# Patient Record
Sex: Female | Born: 1975 | Race: Black or African American | Hispanic: No
Health system: Midwestern US, Community
[De-identification: ages and names within clinical notes are randomized; demographics above are authoritative.]

## PROBLEM LIST (undated history)

## (undated) HISTORY — PX: WRIST FRACTURE SURGERY: SHX121

## (undated) HISTORY — PX: FOOT FRACTURE SURGERY: SHX645

## (undated) HISTORY — PX: APPENDECTOMY: SHX54

---

## 1998-12-25 ENCOUNTER — Emergency Department (HOSPITAL_COMMUNITY): Admission: EM | Admit: 1998-12-25 | Discharge: 1998-12-25 | Payer: Self-pay | Admitting: Emergency Medicine

## 1998-12-25 ENCOUNTER — Encounter: Payer: Self-pay | Admitting: Emergency Medicine

## 1999-03-11 ENCOUNTER — Emergency Department (HOSPITAL_COMMUNITY): Admission: EM | Admit: 1999-03-11 | Discharge: 1999-03-11 | Payer: Self-pay | Admitting: Emergency Medicine

## 2000-10-07 ENCOUNTER — Inpatient Hospital Stay (HOSPITAL_COMMUNITY): Admission: EM | Admit: 2000-10-07 | Discharge: 2000-10-08 | Payer: Self-pay | Admitting: Emergency Medicine

## 2000-10-08 ENCOUNTER — Inpatient Hospital Stay (HOSPITAL_COMMUNITY): Admission: EM | Admit: 2000-10-08 | Discharge: 2000-10-10 | Payer: Self-pay | Admitting: *Deleted

## 2000-10-13 ENCOUNTER — Other Ambulatory Visit (HOSPITAL_COMMUNITY): Admission: RE | Admit: 2000-10-13 | Discharge: 2000-10-15 | Payer: Self-pay | Admitting: Psychiatry

## 2001-04-09 ENCOUNTER — Emergency Department (HOSPITAL_COMMUNITY): Admission: EM | Admit: 2001-04-09 | Discharge: 2001-04-09 | Payer: Self-pay | Admitting: Emergency Medicine

## 2001-04-09 ENCOUNTER — Encounter: Payer: Self-pay | Admitting: Emergency Medicine

## 2003-03-25 ENCOUNTER — Other Ambulatory Visit: Admission: RE | Admit: 2003-03-25 | Discharge: 2003-03-25 | Payer: Self-pay | Admitting: Gynecology

## 2003-05-10 ENCOUNTER — Emergency Department (HOSPITAL_COMMUNITY): Admission: EM | Admit: 2003-05-10 | Discharge: 2003-05-11 | Payer: Self-pay | Admitting: Emergency Medicine

## 2003-05-12 ENCOUNTER — Emergency Department (HOSPITAL_COMMUNITY): Admission: EM | Admit: 2003-05-12 | Discharge: 2003-05-12 | Payer: Self-pay | Admitting: Emergency Medicine

## 2009-05-02 ENCOUNTER — Inpatient Hospital Stay (HOSPITAL_COMMUNITY): Admission: RE | Admit: 2009-05-02 | Discharge: 2009-05-10 | Payer: Self-pay | Admitting: Psychiatry

## 2009-05-02 ENCOUNTER — Ambulatory Visit: Payer: Self-pay | Admitting: Psychiatry

## 2009-05-07 ENCOUNTER — Emergency Department (HOSPITAL_COMMUNITY): Admission: EM | Admit: 2009-05-07 | Discharge: 2009-05-07 | Payer: Self-pay | Admitting: Emergency Medicine

## 2009-08-25 ENCOUNTER — Emergency Department (HOSPITAL_COMMUNITY): Admission: EM | Admit: 2009-08-25 | Discharge: 2009-08-26 | Payer: Self-pay | Admitting: Emergency Medicine

## 2009-11-03 ENCOUNTER — Emergency Department (HOSPITAL_COMMUNITY): Admission: EM | Admit: 2009-11-03 | Discharge: 2009-11-03 | Payer: Self-pay | Admitting: Emergency Medicine

## 2009-11-09 ENCOUNTER — Ambulatory Visit (HOSPITAL_COMMUNITY): Admission: RE | Admit: 2009-11-09 | Discharge: 2009-11-09 | Payer: Self-pay | Admitting: Psychiatry

## 2010-08-07 ENCOUNTER — Emergency Department (HOSPITAL_COMMUNITY)
Admission: EM | Admit: 2010-08-07 | Discharge: 2010-08-08 | Payer: Self-pay | Source: Home / Self Care | Admitting: Emergency Medicine

## 2010-10-10 LAB — CBC
HCT: 35.3 % — ABNORMAL LOW (ref 36.0–46.0)
Hemoglobin: 11.9 g/dL — ABNORMAL LOW (ref 12.0–15.0)
MCHC: 33.7 g/dL (ref 30.0–36.0)
Platelets: 341 10*3/uL (ref 150–400)
RDW: 13.4 % (ref 11.5–15.5)

## 2010-10-10 LAB — URINALYSIS, ROUTINE W REFLEX MICROSCOPIC
Ketones, ur: NEGATIVE mg/dL
Nitrite: NEGATIVE
Specific Gravity, Urine: 1.014 (ref 1.005–1.030)
pH: 6 (ref 5.0–8.0)

## 2010-10-10 LAB — URINE MICROSCOPIC-ADD ON

## 2010-10-10 LAB — COMPREHENSIVE METABOLIC PANEL
BUN: 7 mg/dL (ref 6–23)
Calcium: 8.9 mg/dL (ref 8.4–10.5)
Glucose, Bld: 104 mg/dL — ABNORMAL HIGH (ref 70–99)
Sodium: 130 mEq/L — ABNORMAL LOW (ref 135–145)
Total Protein: 8.1 g/dL (ref 6.0–8.3)

## 2010-10-10 LAB — DIFFERENTIAL
Lymphocytes Relative: 7 % — ABNORMAL LOW (ref 12–46)
Lymphs Abs: 0.9 10*3/uL (ref 0.7–4.0)
Monocytes Relative: 6 % (ref 3–12)
Neutro Abs: 11.1 10*3/uL — ABNORMAL HIGH (ref 1.7–7.7)
Neutrophils Relative %: 86 % — ABNORMAL HIGH (ref 43–77)

## 2010-10-10 LAB — URINE CULTURE: Colony Count: 60000

## 2010-10-10 LAB — LIPASE, BLOOD: Lipase: 23 U/L (ref 11–59)

## 2010-10-25 LAB — BASIC METABOLIC PANEL
CO2: 27 mEq/L (ref 19–32)
Calcium: 9 mg/dL (ref 8.4–10.5)
Glucose, Bld: 92 mg/dL (ref 70–99)
Sodium: 136 mEq/L (ref 135–145)

## 2010-10-25 LAB — URINALYSIS, ROUTINE W REFLEX MICROSCOPIC
Bilirubin Urine: NEGATIVE
Glucose, UA: NEGATIVE mg/dL
Glucose, UA: NEGATIVE mg/dL
Hgb urine dipstick: NEGATIVE
Protein, ur: NEGATIVE mg/dL
Specific Gravity, Urine: 1.021 (ref 1.005–1.030)
Urobilinogen, UA: 0.2 mg/dL (ref 0.0–1.0)
pH: 6.5 (ref 5.0–8.0)

## 2010-10-25 LAB — CBC
HCT: 37.7 % (ref 36.0–46.0)
HCT: 37.2 % (ref 36.0–46.0)
Hemoglobin: 12.5 g/dL (ref 12.0–15.0)
Hemoglobin: 12.6 g/dL (ref 12.0–15.0)
Hemoglobin: 12.8 g/dL (ref 12.0–15.0)
MCHC: 33.9 g/dL (ref 30.0–36.0)
MCHC: 34.5 g/dL (ref 30.0–36.0)
RBC: 4.36 MIL/uL (ref 3.87–5.11)
RDW: 13.5 % (ref 11.5–15.5)
RDW: 13.8 % (ref 11.5–15.5)
RDW: 13.3 % (ref 11.5–15.5)

## 2010-10-25 LAB — DRUGS OF ABUSE SCREEN W/O ALC, ROUTINE URINE
Amphetamine Screen, Ur: NEGATIVE
Creatinine,U: 260.1 mg/dL
Marijuana Metabolite: NEGATIVE
Methadone: NEGATIVE
Opiate Screen, Urine: NEGATIVE

## 2010-10-25 LAB — GC/CHLAMYDIA PROBE AMP, URINE: GC Probe Amp, Urine: NEGATIVE

## 2010-10-25 LAB — COMPREHENSIVE METABOLIC PANEL
ALT: 18 U/L (ref 0–35)
Alkaline Phosphatase: 53 U/L (ref 39–117)
BUN: 6 mg/dL (ref 6–23)
BUN: 7 mg/dL (ref 6–23)
CO2: 29 mEq/L (ref 19–32)
Calcium: 8.9 mg/dL (ref 8.4–10.5)
GFR calc non Af Amer: >60 mL/min (ref >60)
Glucose, Bld: 84 mg/dL (ref 70–99)
Glucose, Bld: 82 mg/dL (ref 70–99)
Potassium: 3.6 mEq/L (ref 3.5–5.1)
Sodium: 138 mEq/L (ref 135–145)
Total Bilirubin: 0.8 mg/dL (ref 0.3–1.2)
Total Protein: 7.6 g/dL (ref 6.0–8.3)

## 2010-10-25 LAB — URINALYSIS, MICROSCOPIC ONLY
Glucose, UA: NEGATIVE mg/dL
Leukocytes, UA: NEGATIVE
Protein, ur: NEGATIVE mg/dL
Specific Gravity, Urine: 1.024 (ref 1.005–1.030)
Urobilinogen, UA: 0.2 mg/dL (ref 0.0–1.0)

## 2010-10-25 LAB — URINE MICROSCOPIC-ADD ON

## 2010-10-25 LAB — D-DIMER, QUANTITATIVE: D-Dimer, Quant: <0.22 ug/mL-FEU (ref 0.00–0.48)

## 2010-10-25 LAB — VALPROIC ACID LEVEL: Valproic Acid Lvl: 79.4 ug/mL (ref 50.0–100.0)

## 2010-12-07 NOTE — Discharge Summary (Signed)
[facility]  Patient:    [name], Linda Ramos                       MRN: [id number] Adm. Date:  [id number] Disc. Date: [id number] Attending:  Carolanne Grumbling D                           Discharge Summary  INTRODUCTION:  Linda Ramos [name] is a 35 year old African-American female who was admitted because of signs of depression.  She was transferred to [place] from [facility] after overdosing of approximately 50 aspirin.  Patient was not able to contract for safety and she was sent on involuntary commitment to our Behavioral Health unit.  The main factor contributing to her depression was pressure at work and some family problems.  Patient has previous history of suicidal gesture 2 years ago.  Details of patients history are available in the initial evaluation.  HOSPITAL COURSE:  After admitting to the ward, patient was placed on special observation and medication was offered.  Patient did not feel that she needed medication.  On examination upon admission, she presented with guarded, nonspontaneous mood and having partial insight into her actions leading to admission.  She did not want to stay in the  hospital, felt that she could benefit more from outpatient treatment in intensive outpatient program.  Case worker met with patients best friend and mother and mother was approving this plan.  Patient upon discharge did not want to take any medication.  MEDICAL COMPLICATIONS DURING THIS HOSPITAL STAY:  Patient did not have any medical problems or sequelae from her recent overdose.  Vital signs were stable.  Additional blood work showed normal CBC with borderline low white blood cell count 3.5 thousand per microliter.  Otherwise CBC was normal. Chemistry-17 was within normal limits.  Iron and total iron binding capacity were normal.  Percent of iron saturation was 15, slightly below normal.  ADMISSION DIAGNOSIS: Axis I:     Major depression, recurrent,  overlapping with adjustment disorder             with depressed mood. Axis II:    Diagnosis deferred. Axis III:   Status post overdose with questionable suicidal attempt on             aspirin on October 07, 2000. Axis IV:    Psychosocial stressors moderate to severe, occupational problems. Axis V:     Global assessment of function on admission was 30, upon discharge             60, maximum for past year 80.  DISCHARGE RECOMMENDATIONS:  Patient refused to take any medications.  She was discharged to mental health intensive outpatient program which is supposed to be started on October 13, 2000.  Over the weekend, she will stay with her family. DD:  11/06/00 TD:  11/06/00 Job: [id number]

## 2010-12-07 NOTE — H&P (Signed)
[facility]  Patient:    [name], Manasvini S                       MRN: [id number] Adm. Date:  [id number] Attending:  Denny Peon Dictator:   Johnella Moloney, N.P.                         History and Physical  IDENTIFYING INFORMATION:  Ms. [name] is a 35 year old, African-American female, single admitted October 08, 2000 on an involuntary commitment.  CHIEF COMPLAINT:  "Here for depression."  HISTORY OF PRESENT ILLNESS:  The patient was hospitalized at [facility] on October 07, 2000 after overdosing on approximately 50 aspirin. She would not contract for safety according to the petition and she was sent to [facility]. She did stay at [facility] from October 07, 2000 through October 08, 2000. The patient has very little to say and seems very resistant to give [place] much detail or much information. She states that she suddenly got depressed on October 07, 2000. She said she was upset about life and there was a lot of pressure at work and at times work was becoming overwhelming. She states she took the aspirin, although she does not know how many and she says this was not a suicide attempt, she thought she would just sleep. The patient reports that she sleeps a lot anyway. She denies fatigue. She does acknowledge that most of her life at times becomes irritable, angry and has mood swings, and this has occurred most of her life. She feels like she does not really want to go to work at [employer] currently due to the stress. She states that she stays really busy and states that things have seemed more stressful for the last two weeks. She states her appetite varies but she has had a weight loss of 7 pounds in two weeks. She is currently working two jobs averaging 60 hours a week plus going to school, but she does not feel like that this is too much for her. She is resistant to give much detail. 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  PAST PSYCHIATRIC HISTORY:  According to Dr. Tommy Medal consult note, she has a past history of severe depression with suicidal gesture two years ago. The patient denies this and said that she did have depression one time at age 64. She has had no outpatient treatment or inpatient treatment.  PRIMARY CARE PHYSICIAN:  Rudean Haskell, M.D., [place], [place]. She last saw her one week ago.  PAST MEDICAL HISTORY:  She states she is in good health. She did complain of tinnitus after taking the overdose of aspirin.  CURRENT MEDICATIONS:  None currently. She has been on Zoloft in the past but very resistant to taking any type of medication. Apparently, when she saw her primary care physician she wanted her to start back on Zoloft and the patient refused. The patient is also on a vegetarian diet.  ALLERGIES:  PENICILLIN gives her severe headache.  SOCIAL HISTORY:  The patient is single and lives alone in [place]. Her parents are living. She has a close relationship with her mother and not so with her father. She is the only child. She states she completed high school and went to [employer] and has a degree as a Engineer, civil (consulting). She is also in school  for [employer]. Her goal is to become a Clinical research associate and go to law school. She has no significant relationship. She does have three close friends. 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 She has been doing this for two years.  FAMILY HISTORY:  None.  ALCOHOL AND DRUG HISTORY:  The patient denies alcohol use. The patient denies substance abuse. The patient is a nonsmoker.  POSITIVE PHYSICAL FINDINGS:  Please see physical examination from [facility] in the medical unit where she was hospitalized October 07, 2000  through October 08, 2000 for the overdose. She was charcoaled while at the hospital. She did have hypokalemia while she was there. We are going to repeat her CMET and see what her level of potassium is.  PHYSICAL EXAMINATION:  VITAL SIGNS:  Temperature 98.7, pulse 89, respirations 18 and blood pressure 120/79.  CURRENT MENTAL STATUS EXAMINATION:  A young African-American adult female casually dressed. She answers questions minimally with no detail. She has good eye contact but she seems rather withdrawn. Speech is low volume. She is guarded and gives very vague responses to the questions. Mood:  She appears sad and sometimes she gets irritable. Affect is blunted. She did have a serious overdose on October 07, 2000. She denies current suicidal ideation or intent and denies homicidal ideation or intent. Thought process is illogical and coherent without evidence of psychosis. No hallucinations, no delusions. Cognitive:  Alert and oriented. Cognitive function intact. Above average intelligence. Insight poor. Impulse control poor. Judgment poor.  CURRENT DIAGNOSES: Axis I:                       Major depression, recurrent with overdose                               on aspirin -- serious overdose. Axis II:                      Deferred. Axis III:                     Status post overdose on aspirin October 07, 2000. Axis IV:                      Severe related to occupational problems Axis V:                       Current global assessment of functioning 38,                               highest in past year 80.  TREATMENT PLAN AND RECOMMENDATIONS:  Involuntary commitment to [facility]. Our goal will be to maintain her safety, check her every 15 minutes and the patient contracts for safety. The patient is on a vegetarian diet since she has lost 7 pounds in two weeks. We will encourage her to eat and to drink fluids and try to provide her with food that she can  eat. Also, schedule a family session with her mother and patient. Her mother is Eva [name] and the patient is agreeable to this. The patient  currently refuses  all medications. She does not want to take pills, she simply wants to go home. She was able to discuss that she would rather have outpatient treatment than inpatient hospitalization. Tentative length of stay and discharge plan is 3 days. DD:  10/09/00 TD:  10/09/00 Job: [id number]

## 2011-08-13 ENCOUNTER — Encounter (HOSPITAL_COMMUNITY): Payer: Self-pay | Admitting: Emergency Medicine

## 2011-08-13 ENCOUNTER — Emergency Department (HOSPITAL_COMMUNITY)
Admission: EM | Admit: 2011-08-13 | Discharge: 2011-08-13 | Disposition: A | Discharge status: Home / Self Care | Payer: BC Managed Care – PPO | Attending: Emergency Medicine | Admitting: Emergency Medicine

## 2011-08-13 DIAGNOSIS — K5900 Constipation, unspecified: Secondary | ICD-10-CM | POA: Insufficient documentation

## 2011-08-13 DIAGNOSIS — N390 Urinary tract infection, site not specified: Secondary | ICD-10-CM | POA: Insufficient documentation

## 2011-08-13 DIAGNOSIS — M25569 Pain in unspecified knee: Secondary | ICD-10-CM | POA: Insufficient documentation

## 2011-08-13 DIAGNOSIS — R10819 Abdominal tenderness, unspecified site: Secondary | ICD-10-CM | POA: Insufficient documentation

## 2011-08-13 LAB — URINALYSIS, ROUTINE W REFLEX MICROSCOPIC
Glucose, UA: NEGATIVE mg/dL
Nitrite: NEGATIVE
Specific Gravity, Urine: 1.02 (ref 1.005–1.030)
pH: 7 (ref 5.0–8.0)

## 2011-08-13 LAB — URINE MICROSCOPIC-ADD ON

## 2011-08-13 MED ORDER — NITROFURANTOIN MONOHYD MACRO 100 MG PO CAPS: 100.0000 mg | ORAL_CAPSULE | Freq: Two times a day (BID) | ORAL | Status: AC

## 2011-08-13 MED ORDER — IBUPROFEN 200 MG PO TABS
400.0000 mg | ORAL_TABLET | Freq: Once | ORAL | Status: AC
  Administered 2011-08-13: 400 mg via ORAL
  Filled 2011-08-13: qty 2

## 2011-08-13 MED ORDER — POLYETHYLENE GLYCOL 3350 17 GM/SCOOP PO POWD: 17.0000 g | Freq: Every day | ORAL | Status: AC

## 2011-08-13 NOTE — ED Notes (Signed)
Pt is c/o pain in her right side   Pt states she had constipation a while back and she had pain in her rib cage  Pt states the pain resolved and came back about a week ago  Pt is also c/o right knee pain that started about a week ago  Pt has old injury to that knee years ago but nothing recent   Pt states also she is having some irritation to her perineal area  Denies any discharge at this time   Pt states she was given voltaren gel 1% to use for her inflammed rib on the right side  States she is not currently using it at this time

## 2011-08-13 NOTE — ED Notes (Signed)
Pt alert and oriented x4. Respirations even and unlabored, bilateral symmetrical rise and fall of chest. Skin warm and dry. In no acute distress. Denies needs.   

## 2011-08-13 NOTE — ED Provider Notes (Signed)
History     CSN: [id number]  Arrival date & time 08/13/11  0443   First MD Initiated Contact with Patient 08/13/11 0615      Chief Complaint  Patient presents with  . Flank Pain  . Knee Pain    (Consider location/radiation/quality/duration/timing/severity/associated sxs/prior treatment) HPI Comments: Patient complaining of dysuria and increased frequency for the past 2 days.  She denies any fever, chills, or flank pain.   Patient is also complaining of right knee pain. She reports that the pain has been present for the past week.  No known injury.  She reports that she injured the knee previously a few years ago and was diagnosed with an injury to one of the ligaments.  She wore a knee brace at that time, but has not worn a brace for several years.  Patient is a 36 y.o. female presenting with knee pain and dysuria. The history is provided by the patient.  Knee Pain Pertinent negatives include no chills, nausea or vomiting.  Dysuria  This is a new problem. The problem occurs every urination. The problem has been gradually worsening. The quality of the pain is described as burning. There has been no fever. There is no history of pyelonephritis. Associated symptoms include frequency and urgency. Pertinent negatives include no chills, no nausea, no vomiting, no discharge, no hematuria and no flank pain. She has tried nothing for the symptoms. Her past medical history does not include kidney stones or recurrent UTIs.    History reviewed. No pertinent past medical history.  Past Surgical History  Procedure Date  . Wrist fracture surgery   . Appendectomy   . Foot fracture surgery     Family History  Problem Relation Age of Onset  . Hypertension Other   . Diabetes Other   . Cancer Other   . Stroke Other     History  Substance Use Topics  . Smoking status: Never Smoker   . Smokeless tobacco: Not on file  . Alcohol Use: Yes     social     OB History    Grav Para Term Preterm  Abortions TAB SAB Ect Mult Living                  Review of Systems  Constitutional: Negative for chills.  Respiratory: Negative for shortness of breath.   Gastrointestinal: Positive for constipation. Negative for nausea, vomiting, diarrhea, blood in stool and abdominal distention.  Genitourinary: Positive for dysuria, urgency and frequency. Negative for hematuria, flank pain, decreased urine volume, vaginal bleeding, vaginal discharge, difficulty urinating, vaginal pain and pelvic pain.  Musculoskeletal: Negative for gait problem.  Skin: Negative for color change.  Neurological: Negative for dizziness and syncope.    Allergies  Latex and Penicillins  Home Medications   Current Outpatient Rx  Name Route Sig Dispense Refill  . CLONAZEPAM 2 MG PO TABS Oral Take 2 mg by mouth 4 (four) times daily as needed.      BP 136/76  Pulse 81  Temp(Src) 98.7 F (37.1 C) (Oral)  Resp 18  SpO2 99%  Physical Exam  Nursing note and vitals reviewed. Constitutional: She is oriented to person, place, and time. She appears well-developed and well-nourished.  HENT:  Head: Normocephalic and atraumatic.  Cardiovascular: Normal rate, regular rhythm and normal heart sounds.   Pulmonary/Chest: Effort normal and breath sounds normal. No respiratory distress. She has no wheezes.  Abdominal: Soft. Bowel sounds are normal. She exhibits no distension and no mass. There  is tenderness in the suprapubic area. There is no rigidity, no rebound, no guarding, no CVA tenderness, no tenderness at McBurney's point and negative Murphy's sign.  Musculoskeletal: Normal range of motion.       Right knee: She exhibits normal range of motion, no swelling, no effusion, no deformity, no laceration, no erythema, no LCL laxity, no bony tenderness and no MCL laxity.  Neurological: She is alert and oriented to person, place, and time.  Skin: Skin is warm and dry. No rash noted.  Psychiatric: She has a normal mood and affect.      ED Course  Procedures (including critical care time)  Labs Reviewed  URINALYSIS, ROUTINE W REFLEX MICROSCOPIC - Abnormal; Notable for the following:    APPearance CLOUDY (*)    Hgb urine dipstick SMALL (*)    Protein, ur 100 (*)    Leukocytes, UA LARGE (*)    All other components within normal limits  URINE MICROSCOPIC-ADD ON - Abnormal; Notable for the following:    Squamous Epithelial / LPF MANY (*)    Bacteria, UA MANY (*)    All other components within normal limits   No results found.   No diagnosis found.    MDM  Pt has been diagnosed with a UTI. Pt is afebrile, no CVA tenderness, normotensive, and denies N/V. Pt to be dc home with antibiotics and instructions to follow up with PCP if symptoms persist.  Patient also given knee sleeve for knee pain.  Patient able to ambulate without difficulty.  No acute injury or trauma.  Therefore, xray was not ordered.        Pascal Lux [place], PA-C 08/13/11 1645

## 2011-08-13 NOTE — ED Notes (Signed)
ED PA at bedside

## 2011-08-15 NOTE — ED Provider Notes (Signed)
Medical screening examination/treatment/procedure(s) were performed by non-physician practitioner and as supervising physician I was immediately available for consultation/collaboration.  Ethelda Chick, MD 08/15/11 [phone]

## 2013-05-04 ENCOUNTER — Emergency Department: Payer: Self-pay | Admitting: Emergency Medicine

## 2016-04-28 ENCOUNTER — Emergency Department (HOSPITAL_COMMUNITY)
Admission: EM | Admit: 2016-04-28 | Discharge: 2016-04-28 | Disposition: A | Discharge status: Home / Self Care | Payer: Self-pay | Attending: Emergency Medicine | Admitting: Emergency Medicine

## 2016-04-28 ENCOUNTER — Encounter (HOSPITAL_COMMUNITY): Payer: Self-pay | Admitting: Nurse Practitioner

## 2016-04-28 DIAGNOSIS — Z79899 Other long term (current) drug therapy: Secondary | ICD-10-CM | POA: Insufficient documentation

## 2016-04-28 DIAGNOSIS — G8918 Other acute postprocedural pain: Secondary | ICD-10-CM | POA: Insufficient documentation

## 2016-04-28 LAB — URINALYSIS, ROUTINE W REFLEX MICROSCOPIC
BILIRUBIN URINE: NEGATIVE
GLUCOSE, UA: NEGATIVE mg/dL
HGB URINE DIPSTICK: NEGATIVE
KETONES UR: NEGATIVE mg/dL
NITRITE: NEGATIVE
PH: 5.5 (ref 5.0–8.0)
Protein, ur: NEGATIVE mg/dL
SPECIFIC GRAVITY, URINE: 1.034 — AB (ref 1.005–1.030)

## 2016-04-28 LAB — CBC WITH DIFFERENTIAL/PLATELET
Basophils Absolute: 0 10*3/uL (ref 0.0–0.1)
Basophils Relative: 0 %
Eosinophils Absolute: 0 10*3/uL (ref 0.0–0.7)
Eosinophils Relative: 0 %
HEMATOCRIT: 41.6 % (ref 36.0–46.0)
HEMOGLOBIN: 13.5 g/dL (ref 12.0–15.0)
LYMPHS PCT: 21 %
Lymphs Abs: 1.6 10*3/uL (ref 0.7–4.0)
MCH: 29 pg (ref 26.0–34.0)
MCHC: 32.5 g/dL (ref 30.0–36.0)
MCV: 89.5 fL (ref 78.0–100.0)
MONO ABS: 0.5 10*3/uL (ref 0.1–1.0)
MONOS PCT: 7 %
NEUTROS ABS: 5.3 10*3/uL (ref 1.7–7.7)
Neutrophils Relative %: 72 %
Platelets: 285 10*3/uL (ref 150–400)
RBC: 4.65 MIL/uL (ref 3.87–5.11)
RDW: 12.7 % (ref 11.5–15.5)
WBC: 7.4 10*3/uL (ref 4.0–10.5)

## 2016-04-28 LAB — COMPREHENSIVE METABOLIC PANEL
ALK PHOS: 51 U/L (ref 38–126)
ALT: 13 U/L — ABNORMAL LOW (ref 14–54)
ANION GAP: 7 (ref 5–15)
AST: 16 U/L (ref 15–41)
Albumin: 4.4 g/dL (ref 3.5–5.0)
BILIRUBIN TOTAL: 0.5 mg/dL (ref 0.3–1.2)
BUN: 11 mg/dL (ref 6–20)
CALCIUM: 9.2 mg/dL (ref 8.9–10.3)
CO2: 25 mmol/L (ref 22–32)
Chloride: 104 mmol/L (ref 101–111)
Creatinine, Ser: 0.82 mg/dL (ref 0.44–1.00)
GFR calc Af Amer: >60 mL/min (ref >60)
Glucose, Bld: 100 mg/dL — ABNORMAL HIGH (ref 65–99)
POTASSIUM: 3.9 mmol/L (ref 3.5–5.1)
Sodium: 136 mmol/L (ref 135–145)
TOTAL PROTEIN: 7.9 g/dL (ref 6.5–8.1)

## 2016-04-28 LAB — LIPASE, BLOOD: LIPASE: 25 U/L (ref 11–51)

## 2016-04-28 LAB — URINE MICROSCOPIC-ADD ON: RBC / HPF: NONE SEEN RBC/hpf (ref 0–5)

## 2016-04-28 LAB — PREGNANCY, URINE: Preg Test, Ur: NEGATIVE

## 2016-04-28 LAB — WET PREP, GENITAL
Clue Cells Wet Prep HPF POC: NONE SEEN
SPERM: NONE SEEN
TRICH WET PREP: NONE SEEN
Yeast Wet Prep HPF POC: NONE SEEN

## 2016-04-28 NOTE — ED Triage Notes (Signed)
Pt states she an emergent append[place], states the umbilical incision has somewhat been causing her a significant amount of pain and discomfort especially with activity, also noticed recently she has vaginal discomfort despite being vaginally sexually inactive post surgery.OTC yeast infection remedy have not helped.

## 2016-04-28 NOTE — Discharge Instructions (Signed)
Continue to take your home pain medications as prescribed. I recommend applying a small amount of Vasoline to your outer vaginal area to help with your discomfort.  Labs regarding your gonorrhea and chlamydia are pending. If any of your results are positive you will receive a call from the hospital in the next 2-3 days. Call the general surgery clinic listed above to schedule a follow-up appointment within the next week regarding her pain has been present after your appendectomy.  Please return to the Emergency Department if symptoms worsen or new onset of fever, chest pain, difficulty breathing, new/worsening abdominal pain, vomiting, unable to keep fluids down, vaginal bleeding, vaginal discharge.

## 2016-04-28 NOTE — ED Provider Notes (Signed)
[facility]-EMERGENCY DEPT Provider Note   CSN: [id number] Arrival date & time: 04/28/16  1543     History   Chief Complaint Chief Complaint  Patient presents with  . Post-op Problem  . Vaginal Itching    HPI Linda Ramos [name] is a 40 y.o. female.  Patient is a 40 year old female with history of appendectomy on 04/11/16 (in [place]) who presents the ED with complaint of abdominal pain. Patient states since having her appendectomy she has had intermittent sharp pain to her mid and right lower quadrant which she states typically occurs with bending, twisting or straining to pick something up. Pain is relieved if she is sitting still and relaxing. She notes she has been taking her prescription of oxycodone as prescribed when needed with relief of pain. She denies having any follow-up since returning home s/p surgery. Patient also reports having "vaginal discomfort" for the past 2 days. She notes she used OTC Monistat last night for a suspected yeast infection. Patient reports she has frequent yeast infections. Denies fever, chills, chest pain, difficulty breathing, nausea, vomiting, diarrhea, constipation, dysuria, hematuria, vaginal discharge, vaginal bleeding. Pt reports she has an IUD and notes she has not had a menstrual cycle for the past 6 years since placement. Denies any other hx of abdominal surgeries.       History reviewed. No pertinent past medical history.  There are no active problems to display for this patient.   Past Surgical History:  Procedure Laterality Date  . APPENDECTOMY    . FOOT FRACTURE SURGERY    . WRIST FRACTURE SURGERY      OB History    No data available       Home Medications    Prior to Admission medications   Medication Sig Start Date End Date Taking? Authorizing Provider  acetaminophen (TYLENOL) 500 MG tablet Take 1,000 mg by mouth every 6 (six) hours as needed for headache.   Yes Historical Provider, MD    Family History Family  History  Problem Relation Age of Onset  . Hypertension Other   . Diabetes Other   . Cancer Other   . Stroke Other     Social History Social History  Substance Use Topics  . Smoking status: Never Smoker  . Smokeless tobacco: Not on file  . Alcohol use Yes     Comment: social      Allergies   Morphine and related; Oxycodone; Percocet [oxycodone-acetaminophen]; Latex; and Penicillins   Review of Systems Review of Systems  Gastrointestinal: Positive for abdominal pain.  Genitourinary: Positive for vaginal pain.  All other systems reviewed and are negative.    Physical Exam Updated Vital Signs BP 131/78   Pulse 84   Temp 99.2 F (37.3 C) (Oral)   Resp 18   Ht 5\' 7"  (1.702 m)   Wt 104.8 kg   SpO2 97%   BMI 36.18 kg/m   Physical Exam  Constitutional: She is oriented to person, place, and time. She appears well-developed and well-nourished. No distress.  HENT:  Head: Normocephalic and atraumatic.  Mouth/Throat: Oropharynx is clear and moist. No oropharyngeal exudate.  Eyes: Conjunctivae and EOM are normal. Right eye exhibits no discharge. Left eye exhibits no discharge. No scleral icterus.  Neck: Normal range of motion. Neck supple.  Cardiovascular: Normal rate, regular rhythm, normal heart sounds and intact distal pulses.   HR 88  Pulmonary/Chest: Effort normal and breath sounds normal. No respiratory distress. She has no wheezes. She has no rales. She exhibits  no tenderness.  Abdominal: Soft. Bowel sounds are normal. She exhibits no distension and no mass. There is tenderness (mild TTP over periumbilical region). There is no rebound and no guarding. No hernia.  No CVA tenderness. Well healing surgical scar noted at umbilicus with no erythema, swelling, warmth or drainage.  Musculoskeletal: She exhibits no edema.  Neurological: She is alert and oriented to person, place, and time.  Skin: Skin is warm and dry. She is not diaphoretic.  Nursing note and vitals  reviewed.  Pelvic exam: normal external genitalia, vulva, vagina, cervix, uterus and adnexa, VULVA: normal appearing vulva with no masses, tenderness or lesions, VAGINA: normal appearing vagina with normal color and discharge, no lesions, vaginal discharge - white and curd-like, CERVIX: normal appearing cervix without discharge or lesions, WET MOUNT done - results: white blood cells, DNA probe for chlamydia and GC obtained, UTERUS: uterus is normal size, shape, consistency and nontender, ADNEXA: normal adnexa in size, nontender and no masses, exam chaperoned by female tech.   ED Treatments / Results  Labs (all labs ordered are listed, but only abnormal results are displayed) Labs Reviewed  WET PREP, GENITAL - Abnormal; Notable for the following:       Result Value   WBC, Wet Prep HPF POC MANY (*)    All other components within normal limits  COMPREHENSIVE METABOLIC PANEL - Abnormal; Notable for the following:    Glucose, Bld 100 (*)    ALT 13 (*)    All other components within normal limits  URINALYSIS, ROUTINE W REFLEX MICROSCOPIC (NOT AT [facility]) - Abnormal; Notable for the following:    APPearance TURBID (*)    Specific Gravity, Urine 1.034 (*)    Leukocytes, UA MODERATE (*)    All other components within normal limits  URINE MICROSCOPIC-ADD ON - Abnormal; Notable for the following:    Squamous Epithelial / LPF 6-30 (*)    Bacteria, UA FEW (*)    Crystals CA OXALATE CRYSTALS (*)    All other components within normal limits  URINE CULTURE  CBC WITH DIFFERENTIAL/PLATELET  LIPASE, BLOOD  PREGNANCY, URINE  GC/CHLAMYDIA PROBE AMP ([place]) NOT AT [facility]    EKG  EKG Interpretation None       Radiology No results found.  Procedures Procedures (including critical care time)  Medications Ordered in ED Medications - No data to display   Initial Impression / Assessment and Plan / ED Course  I have reviewed the triage vital signs and the nursing notes.  Pertinent labs &  imaging results that were available during my care of the patient were reviewed by me and considered in my medical decision making (see chart for details).  Clinical Course    Patient presents with right lower abdominal pain which she states has been present after having her appendectomy performed on 9/25 in [place]. Pain occurs with movement or straining. She also reports having vaginal discomfort, denies vaginal discharge but states it feels similar to when she has had decent infections in the past. Denies fever. VSS. Exam revealed mild tenderness over periumbilical region, no peritoneal signs, well-healing surgical scar noted to umbilicus without evidence of erythema, warmth or drainage. Pelvic exam revealed white discharge in vaginal vault, no CMT or adnexal tenderness. UA positive for moderate leuks, 6-30 epithelial cells, few bacteria and calcium oxalate crystals; suspect contamination but will send urine culture, do not feel that initiation of antibiotics are warranted at this time as pt is without urinary sxs. Wet prep negative.  Remaining labs unremarkable. Labs pending for gonorrhea and chlamydia. Suspect patient's abdominal pain is likely due to non-emergent post-op pain and suspect an acute surgical abdomen at this time warranted further workup/imaging. Discussed results and plan for discharge patient. Plan to discharge patient home with symptomatic treatment and advised to take her home pain meds as needed. Discussed pending labs with patient. Patient given information to follow up with general surgery regarding her abdominal pain status post appendectomy. Discussed return precautions.  Final Clinical Impressions(s) / ED Diagnoses   Final diagnoses:  Post-operative pain    New Prescriptions Discharge Medication List as of 04/28/2016  7:58 PM       Barrett HenleNicole Elizabeth Nadeau, PA-C 04/28/16 2029    Benjiman CoreNathan Pickering, MD 04/28/16 [phone]

## 2016-04-29 LAB — GC/CHLAMYDIA PROBE AMP (~~LOC~~) NOT AT ARMC
Chlamydia: NEGATIVE
Neisseria Gonorrhea: NEGATIVE

## 2016-05-01 LAB — URINE CULTURE: Culture: 80000 — AB

## 2016-05-02 ENCOUNTER — Telehealth (HOSPITAL_BASED_OUTPATIENT_CLINIC_OR_DEPARTMENT_OTHER): Payer: Self-pay | Admitting: Emergency Medicine

## 2016-05-02 NOTE — Telephone Encounter (Signed)
Post ED Visit - Positive Culture Follow-up  Culture report reviewed by antimicrobial stewardship pharmacist:  []  Enzo BiNathan Batchelder, Pharm.D. []  Celedonio MiyamotoJeremy Frens, Pharm.D., BCPS []  Garvin FilaMike Maccia, Pharm.D. []  Georgina PillionElizabeth Martin, [address]Ph[address] BCPS []  NeffsMinh Pham, [place]D., BCPS, AAHIVP []  Estella HuskMichelle Turner, Pharm.D., BCPS, AAHIVP []  Tennis Mustassie Stewart, Pharm.D. []  Sherle Poeob Vincent, [address]Ph[address] Mackie Paienee Ackley PharmD  Positive urine culture Treated with none, asmptomatic, and no further patient follow-up is required at this time.  Berle MullMiller, Inza Mikrut 05/02/2016, 9:10 AM

## 2016-08-06 ENCOUNTER — Other Ambulatory Visit: Payer: Self-pay | Admitting: *Deleted

## 2016-08-06 DIAGNOSIS — Z1231 Encounter for screening mammogram for malignant neoplasm of breast: Secondary | ICD-10-CM

## 2017-01-14 ENCOUNTER — Ambulatory Visit (HOSPITAL_COMMUNITY): Payer: Self-pay

## 2017-08-20 ENCOUNTER — Encounter (HOSPITAL_COMMUNITY): Payer: Self-pay

## 2017-08-20 ENCOUNTER — Emergency Department (HOSPITAL_COMMUNITY): Payer: Self-pay

## 2017-08-20 ENCOUNTER — Emergency Department (HOSPITAL_COMMUNITY): Admission: EM | Admit: 2017-08-20 | Discharge: 2017-08-20 | Discharge status: Absent without leave | Payer: Self-pay

## 2017-08-20 DIAGNOSIS — Z91040 Latex allergy status: Secondary | ICD-10-CM | POA: Insufficient documentation

## 2017-08-20 DIAGNOSIS — J111 Influenza due to unidentified influenza virus with other respiratory manifestations: Secondary | ICD-10-CM | POA: Insufficient documentation

## 2017-08-20 NOTE — ED Triage Notes (Signed)
Pt has multiple complaints cough, headache, body aches, sore throat, dizziness, CP when coughing, eyes watery, SOB, chills. Symptoms have been going on since last Sat.

## 2017-08-20 NOTE — ED Notes (Signed)
No answer x1

## 2017-08-21 ENCOUNTER — Emergency Department (HOSPITAL_COMMUNITY)
Admission: EM | Admit: 2017-08-21 | Discharge: 2017-08-21 | Disposition: A | Discharge status: Home / Self Care | Payer: Self-pay | Attending: Emergency Medicine | Admitting: Emergency Medicine

## 2017-08-21 DIAGNOSIS — R0789 Other chest pain: Secondary | ICD-10-CM

## 2017-08-21 DIAGNOSIS — R6889 Other general symptoms and signs: Secondary | ICD-10-CM

## 2017-08-21 LAB — I-STAT BETA HCG BLOOD, ED (MC, WL, AP ONLY): I-stat hCG, quantitative: <5 m[IU]/mL (ref <5)

## 2017-08-21 LAB — CBC
HEMATOCRIT: 41.7 % (ref 36.0–46.0)
Hemoglobin: 13.6 g/dL (ref 12.0–15.0)
MCH: 29.7 pg (ref 26.0–34.0)
MCHC: 32.6 g/dL (ref 30.0–36.0)
MCV: 91 fL (ref 78.0–100.0)
Platelets: 278 10*3/uL (ref 150–400)
RBC: 4.58 MIL/uL (ref 3.87–5.11)
RDW: 13.2 % (ref 11.5–15.5)
WBC: 6.7 10*3/uL (ref 4.0–10.5)

## 2017-08-21 LAB — BASIC METABOLIC PANEL
Anion gap: 10 (ref 5–15)
BUN: 5 mg/dL — AB (ref 6–20)
CHLORIDE: 105 mmol/L (ref 101–111)
CO2: 23 mmol/L (ref 22–32)
Calcium: 8.9 mg/dL (ref 8.9–10.3)
Creatinine, Ser: 0.74 mg/dL (ref 0.44–1.00)
GFR calc Af Amer: >60 mL/min (ref >60)
GFR calc non Af Amer: >60 mL/min (ref >60)
GLUCOSE: 127 mg/dL — AB (ref 65–99)
POTASSIUM: 4 mmol/L (ref 3.5–5.1)
Sodium: 138 mmol/L (ref 135–145)

## 2017-08-21 LAB — I-STAT TROPONIN, ED: TROPONIN I, POC: 0 ng/mL (ref 0.00–0.08)

## 2017-08-21 MED ORDER — IBUPROFEN 800 MG PO TABS
800.0000 mg | ORAL_TABLET | Freq: Once | ORAL | Status: AC
  Administered 2017-08-21: 800 mg via ORAL
  Filled 2017-08-21: qty 1

## 2017-08-21 MED ORDER — BENZONATATE 100 MG PO CAPS
100.0000 mg | ORAL_CAPSULE | Freq: Once | ORAL | Status: AC
  Administered 2017-08-21: 100 mg via ORAL
  Filled 2017-08-21: qty 1

## 2017-08-21 MED ORDER — BENZONATATE 100 MG PO CAPS: 100.0000 mg | ORAL_CAPSULE | Freq: Three times a day (TID) | ORAL | 0 refills | Status: AC | PRN

## 2017-08-21 NOTE — ED Notes (Signed)
ED Provider at bedside. 

## 2017-08-21 NOTE — Discharge Instructions (Signed)
You may alternate Tylenol 1000 mg every 6 hours as needed for fever and pain and ibuprofen 800 mg every 8 hours as needed for fever and pain. Please rest and drink plenty of fluids. This is a viral illness causing your symptoms. You do not need antibiotics for a virus. You may use over-the-counter nasal saline spray and Afrin nasal saline spray as needed for nasal congestion. Please do not use Afrin for more than 3 days in a row. You may use Mucinex and Dextromethorphan as needed for cough.  You may use lozenges and Chloraseptic spray to help with sore throat.  Warm salt water gargles can also help with sore throat.  You may use over-the-counter Unisom (doxyalamine) or Benadryl to help with sleep.  Please note that some combination medicines such as DayQuil and NyQuil have multiple medications in them.  Please make sure you look at all labels to ensure that you are not taking too much of any one particular medication.  Symptoms from a virus may take 7-14 days to run its course. ° °We do not test for the flu from the emergency department as we do not have rapid flu swabs and it takes hours for this test to come back and it would not change our management. The flu is treated like any other virus with supportive measures as listed above. At this time you are outside the treatment window for Tamiflu. Tamiflu has to be taken within the first 48 hours of symptoms.  Tamiflu has many side effects including nausea, vomiting and diarrhea. ° °

## 2017-08-21 NOTE — ED Provider Notes (Signed)
TIME SEEN: 2:09 AM  CHIEF COMPLAINT: Flulike symptoms  HPI: Patient is a 42 year old female with no significant past medical history who presents to the emergency department with multiple symptoms that started on January 26.  Was seen at urgent care on January 28 and states she had a negative flu test.  Reports she has had fevers, chills, runny nose, nonproductive cough, sore throat, felt weak and dizzy.  Reports she has right-sided sharp chest pain with coughing.  Has had body aches and diffuse throbbing headache.  No neck pain or neck stiffness.  Also reports now her right eye is watering but there are no vision changes, itching, burning, other discharge.  No redness to this eye.  Also states now that her mouth is hurting.  Recently started a new job.  Not aware of any sick contacts.  Did not have an influenza vaccination this year.  No vomiting or diarrhea.  No dysuria or hematuria.  No rash.  No travel.  ROS: See HPI Constitutional:  fever  Eyes: no drainage  ENT: no runny nose   Cardiovascular: Right-sided chest pain  Resp: no SOB  GI: no vomiting, diarrhea GU: no dysuria, hematuria, urinary frequency or urgency Integumentary: no rash  Allergy: no hives  Musculoskeletal: no leg swelling  Neurological: no slurred speech ROS otherwise negative  PAST MEDICAL HISTORY/PAST SURGICAL HISTORY:  History reviewed. No pertinent past medical history.  MEDICATIONS:  Prior to Admission medications   Medication Sig Start Date End Date Taking? Authorizing Provider  acetaminophen (TYLENOL) 500 MG tablet Take 1,000 mg by mouth every 6 (six) hours as needed for headache.    [provider]    ALLERGIES:  Allergies  Allergen Reactions  . Morphine And Related Other (See Comments)    "I don't like it - makes me oversedated"  . Oxycodone Itching  . Percocet [Oxycodone-Acetaminophen] Other (See Comments)    Headache  . Latex Rash  . Penicillins Rash and Other (See Comments)    Has  patient had a PCN reaction causing immediate rash, facial/tongue/throat swelling, SOB or lightheadedness with hypotension: yes Has patient had a PCN reaction causing severe rash involving mucus membranes or skin necrosis: no Has patient had a PCN reaction that required hospitalization unknown Has patient had a PCN reaction occurring within the last 10 years: unknown If all of the above answers are "NO", then may proceed with Cephalosporin use.     SOCIAL HISTORY:  Social History   Tobacco Use  . Smoking status: Never Smoker  . Smokeless tobacco: Never Used  Substance Use Topics  . Alcohol use: Yes    Comment: social     FAMILY HISTORY: Family History  Problem Relation Age of Onset  . Hypertension Other   . Diabetes Other   . Cancer Other   . Stroke Other     EXAM: BP 135/89 (BP Location: Right Arm)   Pulse (!) 120   Temp 99.6 F (37.6 C) (Oral)   Resp 17  CONSTITUTIONAL: Alert and oriented and responds appropriately to questions. Well-appearing; well-nourished HEAD: Normocephalic EYES: Conjunctivae clear, pupils appear equal, EOMI, no discharge, no visual changes ENT: normal nose; moist mucous membranes; No pharyngeal erythema or petechiae, no tonsillar hypertrophy or exudate, no uvular deviation, no unilateral swelling, no trismus or drooling, no muffled voice, normal phonation, no stridor, no dental caries present, no drainable dental abscess noted, no Ludwig's angina, tongue sits flat in the bottom of the mouth, no angioedema, no facial erythema or warmth,  no facial swelling; no pain with movement of the neck. NECK: Supple, no meningismus, no nuchal rigidity, no LAD  CARD: Regular and minimally tachycardic; S1 and S2 appreciated; no murmurs, no clicks, no rubs, no gallops RESP: Normal chest excursion without splinting or tachypnea; breath sounds clear and equal bilaterally; no wheezes, no rhonchi, no rales, no hypoxia or respiratory distress, speaking full  sentences ABD/GI: Normal bowel sounds; non-distended; soft, non-tender, no rebound, no guarding, no peritoneal signs, no hepatosplenomegaly BACK:  The back appears normal and is non-tender to palpation, there is no CVA tenderness EXT: Normal ROM in all joints; non-tender to palpation; no edema; normal capillary refill; no cyanosis, no calf tenderness or swelling    SKIN: Normal color for age and race; warm; no rash NEURO: Moves all extremities equally PSYCH: The patient's mood and manner are appropriate. Grooming and personal hygiene are appropriate.  MEDICAL DECISION MAKING: Patient here with flulike symptoms.  Outside of treatment window for Tamiflu.  Nothing at this time to suggest bacterial infection.  Chest pain seems very atypical, musculoskeletal in nature and only present with coughing.  Troponin obtained in triage is negative.  Chest x-ray clear.  No sign of volume overload, infiltrate, pneumothorax.  Doubt ACS, PE or dissection.  I do not feel she needs antibiotics at this time.  She does not appear toxic or septic.  Doubt meningitis, pneumonia, bacteremia, UTI.  We will treat symptomatically with ibuprofen and Tessalon Perles.  Recommended supportive treatment at home including rest, increase fluid intake, alternating Tylenol and Motrin.  Discussed return precautions.  Patient comfortable with this plan.  Provided with work note.  At this time, I do not feel there is any life-threatening condition present. I have reviewed and discussed all results (EKG, imaging, lab, urine as appropriate) and exam findings with patient/family. I have reviewed nursing notes and appropriate previous records.  I feel the patient is safe to be discharged home without further emergent workup and can continue workup as an outpatient as needed. Discussed usual and customary return precautions. Patient/family verbalize understanding and are comfortable with this plan.  Outpatient follow-up has been provided if needed.  All questions have been answered.      EKG Interpretation  Date/Time:  Wednesday August 20 2017 23:44:49 EST Ventricular Rate:  115 PR Interval:  152 QRS Duration: 66 QT Interval:  284 QTC Calculation: 392 R Axis:   56 Text Interpretation:  Sinus tachycardia Right atrial enlargement Borderline ECG No significant change since last tracing Confirmed by Ward, Baxter Hire [phone]) on 08/21/2017 1:54:22 AM         Ward, Layla Maw, DO 08/21/17 6045

## 2018-07-17 ENCOUNTER — Emergency Department (HOSPITAL_COMMUNITY): Payer: Self-pay

## 2018-07-17 ENCOUNTER — Emergency Department (HOSPITAL_COMMUNITY)
Admission: EM | Admit: 2018-07-17 | Discharge: 2018-07-17 | Disposition: A | Discharge status: Home / Self Care | Payer: Self-pay | Attending: Emergency Medicine | Admitting: Emergency Medicine

## 2018-07-17 ENCOUNTER — Encounter (HOSPITAL_COMMUNITY): Payer: Self-pay

## 2018-07-17 DIAGNOSIS — Z91040 Latex allergy status: Secondary | ICD-10-CM | POA: Insufficient documentation

## 2018-07-17 DIAGNOSIS — M25561 Pain in right knee: Secondary | ICD-10-CM | POA: Insufficient documentation

## 2018-07-17 NOTE — ED Triage Notes (Signed)
Pt complains of right knee pain form two weeks ago, she states she was bending over and it felt like pins going through her knee, it went away but now it's getting worse and its a throbbing pain

## 2018-07-17 NOTE — ED Provider Notes (Signed)
[facility]-EMERGENCY DEPT Provider Note   CSN: [id number] Arrival date & time: 07/17/18  0344     History   Chief Complaint Chief Complaint  Patient presents with  . Knee Pain    HPI Lovena NeighboursRiva Suzette [place] is a 42 y.o. female.  The history is provided by the patient and medical records. No language interpreter was used.  Knee Pain   Pertinent negatives include no numbness.   Lovena NeighboursRiva Suzette [place] is a 42 y.o. female with no pertinent PMH who presents to the Emergency Department complaining of progressively worsening right knee pain over the last 2 weeks.  Patient states that she initially bent over, in a somewhat squatting position, when she had an acute onset of sharp medial right knee pain.  She did not think much of it and thought that it would just get better.  She has continued with her usual activity.  Over the last 2 weeks, she has had progressively worsening pain and swelling.  She will intermittently hit a certain position that will send a sharp pain from the knee down to her foot.  No numbness or weakness.  No medications taken prior to arrival for her symptoms.  Denies any history of similar.  No previous injury or surgery to the knees.  History reviewed. No pertinent past medical history.  There are no active problems to display for this patient.   Past Surgical History:  Procedure Laterality Date  . APPENDECTOMY    . FOOT FRACTURE SURGERY    . WRIST FRACTURE SURGERY       OB History   No obstetric history on file.      Home Medications    Prior to Admission medications   Medication Sig Start Date End Date Taking? Authorizing Provider  acetaminophen (TYLENOL) 500 MG tablet Take 1,000 mg by mouth every 6 (six) hours as needed for headache.    [provider]  benzonatate (TESSALON) 100 MG capsule Take 1 capsule (100 mg total) by mouth 3 (three) times daily as needed for cough. 08/21/17   Ward, Layla MawKristen N, DO    Family  History Family History  Problem Relation Age of Onset  . Hypertension Other   . Diabetes Other   . Cancer Other   . Stroke Other     Social History Social History   Tobacco Use  . Smoking status: Never Smoker  . Smokeless tobacco: Never Used  Substance Use Topics  . Alcohol use: Yes    Comment: social   . Drug use: No     Allergies   Morphine and related; Oxycodone; Percocet [oxycodone-acetaminophen]; Latex; and Penicillins   Review of Systems Review of Systems  Musculoskeletal: Positive for arthralgias, joint swelling and myalgias.  Skin: Negative for color change and wound.  Neurological: Negative for weakness and numbness.     Physical Exam Updated Vital Signs BP (!) 139/92 (BP Location: Left Arm)   Pulse 85   Temp 98.1 F (36.7 C) (Oral)   Resp 14   SpO2 98%   Physical Exam Vitals signs and nursing note reviewed.  Constitutional:      General: She is not in acute distress.    Appearance: She is well-developed.  HENT:     Head: Normocephalic and atraumatic.  Neck:     Musculoskeletal: Neck supple.  Cardiovascular:     Rate and Rhythm: Normal rate and regular rhythm.     Heart sounds: Normal heart sounds. No murmur.  Pulmonary:  Effort: Pulmonary effort is normal. No respiratory distress.     Breath sounds: Normal breath sounds. No wheezing or rales.  Musculoskeletal:     Comments: Right knee with tenderness to palpation of the medial knee with associated swelling. Full ROM. + joint line tenderness. No abnormal alignment or patellar mobility. No bruising, erythema or warmth overlaying the joint. No varus/valgus laxity. Negative drawer's. No crepitus. 2+ DP pulses bilaterally. All compartments are soft. Sensation intact distal to injury.  Skin:    General: Skin is warm and dry.  Neurological:     Mental Status: She is alert.      ED Treatments / Results  Labs (all labs ordered are listed, but only abnormal results are displayed) Labs Reviewed  - No data to display  EKG None  Radiology Dg Knee Complete 4 Views Right  Result Date: 07/17/2018 CLINICAL DATA:  Pt reported she kneeled down onto her knees about 10 days ago to pick up something and it felt like a pin went through her right knee. Pt reported right anterior throbbing and burning knee pain that radiates down the front of her right leg since then. Pt stated it went away but now it's getting worse and its a throbbing pain. EXAM: RIGHT KNEE - COMPLETE 4+ VIEW COMPARISON:  None. FINDINGS: No evidence of fracture, dislocation, or joint effusion. No evidence of arthropathy or other focal bone abnormality. Soft tissues are unremarkable. IMPRESSION: Negative. Electronically Signed   By: Amie Portlandavid  Ormond M.D.   On: 07/17/2018 07:49    Procedures Procedures (including critical care time)  Medications Ordered in ED Medications - No data to display   Initial Impression / Assessment and Plan / ED Course  I have reviewed the triage vital signs and the nursing notes.  Pertinent labs & imaging results that were available during my care of the patient were reviewed by me and considered in my medical decision making (see chart for details).    Lovena NeighboursRiva Suzette [place] is a 42 y.o. female who presents to ED for right knee pain which has been progressively worsening over the last two weeks. Began after she bent down to pick something up and had a sharp pain to medial knee. Ligaments intact. NVI on exam. No warmth/erythema/concerns for septic joint. Does have tenderness and swelling to medial joint line. Possible meniscus injury. X-ray negative. RICE and NSAID treatment discussed. [facility] Encouraged to follow up with her orthopedic doc. Reasons to return to ER discussed and all questions answered.   Final Clinical Impressions(s) / ED Diagnoses   Final diagnoses:  Acute pain of right knee    ED Discharge Orders    None       Ward, Chase PicketJaime Pilcher, PA-C 07/17/18 [id number]     Glynn Octaveancour, Stephen, MD 07/17/18 [phone]

## 2018-07-17 NOTE — Discharge Instructions (Signed)
It was my pleasure taking care of you today!   Ibuprofen as needed for pain. Use crutches as needed for comfort. Ice and elevate knee throughout the day.  Call your orthopedist (information listed) today to schedule follow up appointment for recheck of ongoing knee pain in one week. That appointment can be canceled with a 24-48 hour notice if complete resolution of pain.  Return to the ER for new or worsening symptoms, any additional concerns.

## 2019-02-08 IMAGING — CR DG KNEE COMPLETE 4+V*R*
4 series · 4 of 4 positions shown · non-contrast
Comparison: None.

CLINICAL DATA: Pt reported she kneeled down onto her knees about 10
days ago to pick up something and it felt like a pin went through
her right knee. Pt reported right anterior throbbing and burning
knee pain that radiates down the front of her right leg since then.
Pt stated it went away but now it's getting worse and its a
throbbing pain.

EXAM:
RIGHT KNEE - COMPLETE 4+ VIEW

[t knee ap right]
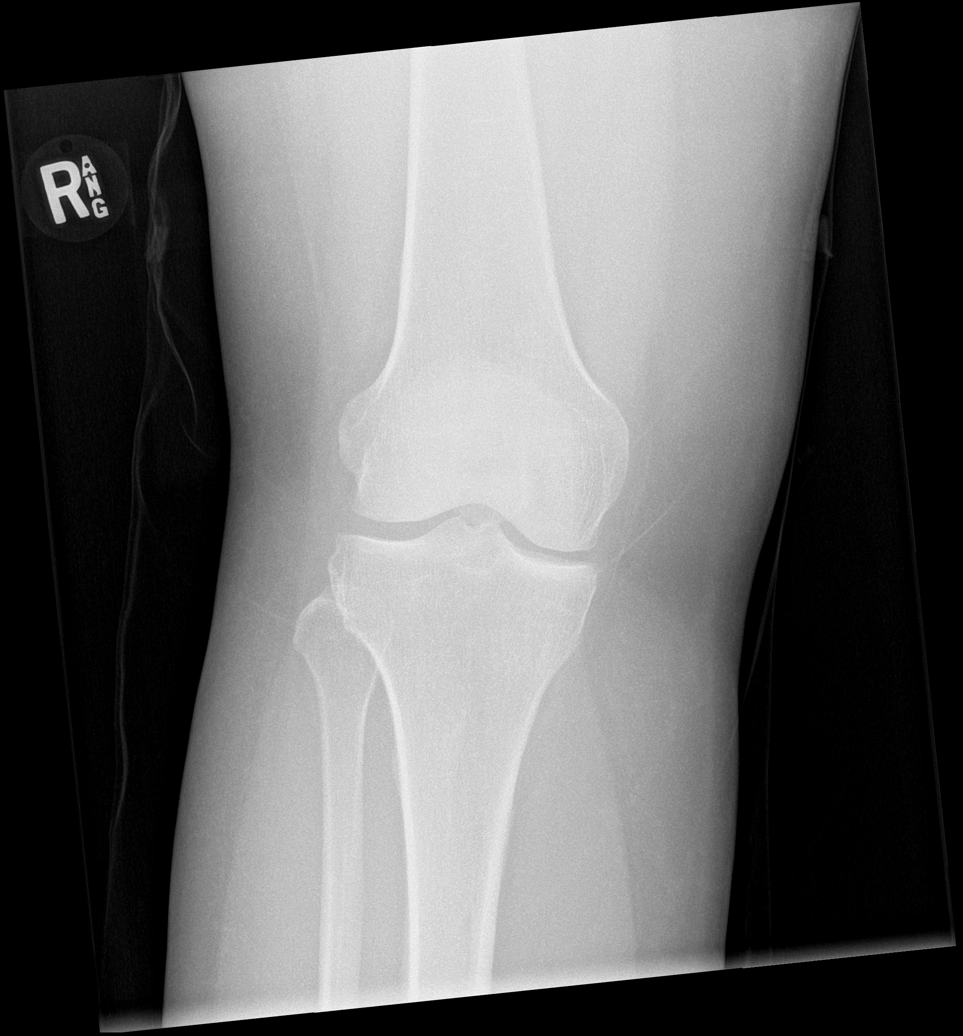

[t knee obl right (1 of 2)]
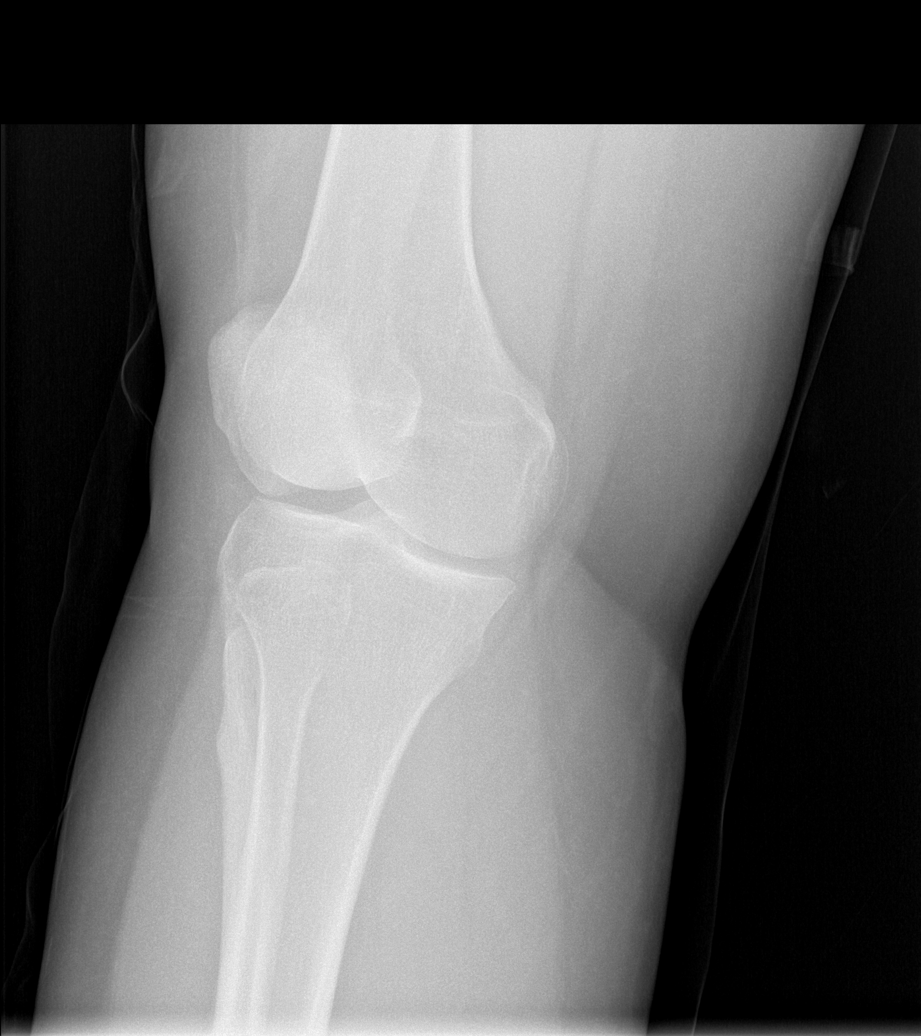

[t knee obl right (2 of 2)]
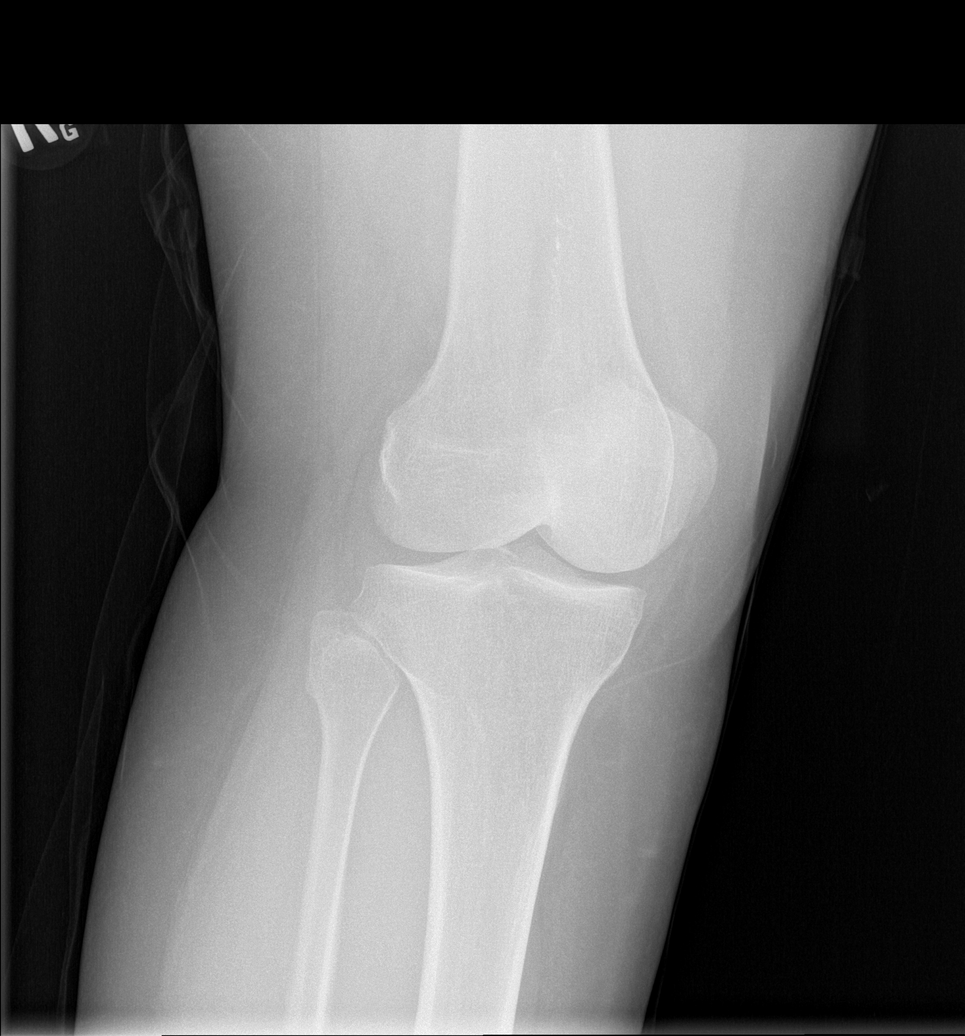

[t knee lat right]
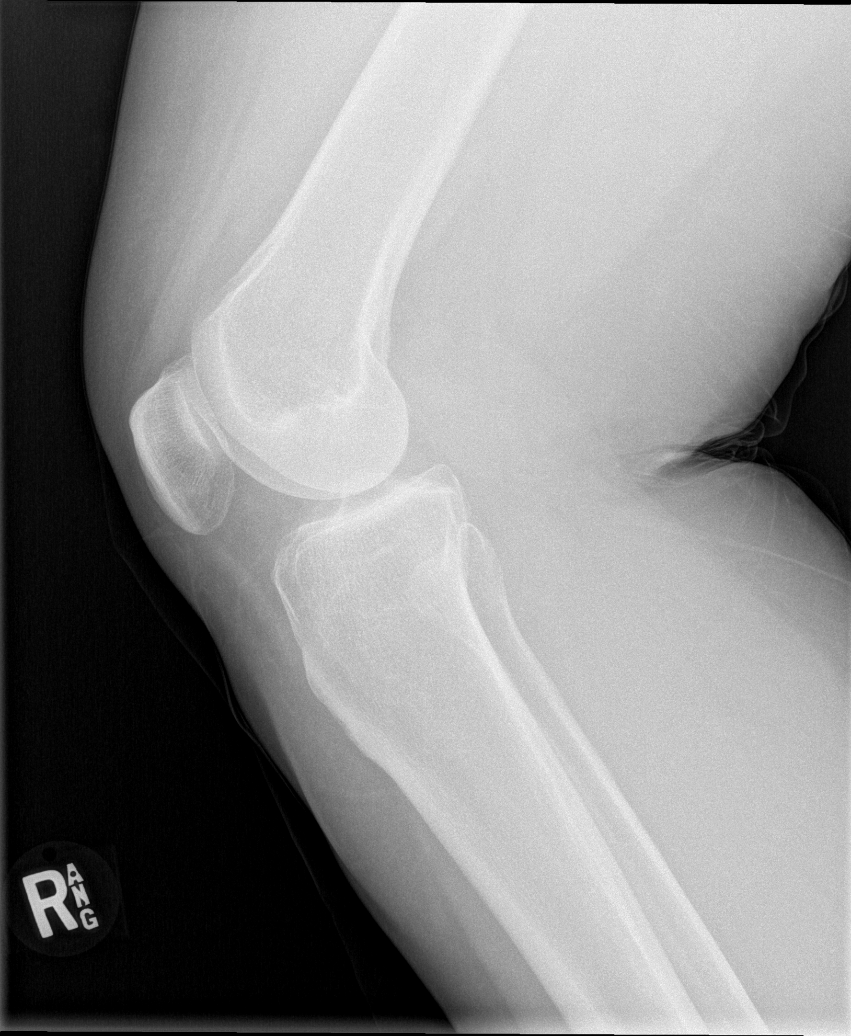

[4 of 4 positions shown; findings below may reference images not displayed]

FINDINGS: No evidence of fracture, dislocation, or joint effusion. No evidence
of arthropathy or other focal bone abnormality. Soft tissues are
unremarkable.
IMPRESSION: Negative.

## 2020-02-18 NOTE — ED Notes (Signed)
ED Triage Note       ED Triage Adult Entered On:  02/18/2020 19:08 EDT    Performed On:  02/18/2020 19:03 EDT by Smitty Knudsen K-RN               Triage   Numeric Rating Pain Scale :   10 = Worst possible pain   ED Pain Details :   Pain Details   Chief Complaint :   Pt to the ED with c/o painful red raised area on her lower back near her coccyx. Pt states that the area has been there for around 2 weeks. Pt denies fever.   [place] Mode of Arrival :   Private vehicle   Infectious Disease Documentation :   Document assessment   Temperature Oral :   37 degC(Converted to: 98.6 degF)    Heart Rate Monitored :   89 bpm   Respiratory Rate :   18 br/min   Systolic Blood Pressure :   148 mmHg (HI)    Diastolic Blood Pressure :   94 mmHg (HI)    SpO2 :   99 %   Oxygen Therapy :   Room air   Patient presentation :   None of the above   Chief Complaint or Presentation suggest infection :   No   Dosing Weight Obtained By :   Estimated   Weight Dosing :   114 kg(Converted to: 251 lb 5 oz)    Height :   170 cm(Converted to: 5 ft 7 in)    Body Mass Index Dosing :   39 kg/m2   Smitty Knudsen K-RN - 02/18/2020 19:03 EDT   DCP GENERIC CODE   Tracking Acuity :   4   Tracking Group :   ED [facility] [employer]   Smitty Knudsen K-RN - 02/18/2020 19:03 EDT   ED General Section :   Document assessment   Pregnancy Status :   Patient denies   ED Allergies Section :   Document assessment   ED Reason for Visit Section :   Document assessment   ED Quick Assessment :   Patient appears awake, alert, oriented to baseline. Skin warm and dry. Moves all extremities. Respiration even and unlabored. Appears in no apparent distress.   Smitty Knudsen K-RN - 02/18/2020 19:03 EDT   ID Risk Screen Symptoms   Recent Travel History :   No recent travel   Close Contact with COVID-19 ID :   No   Last 14 days COVID-19 ID :   No   Smitty Knudsen K-RN - 02/18/2020 19:03 EDT   Allergies   (As Of: 02/18/2020 19:08:08 EDT)   Allergies (Active)   No Known  Medication Allergies  Estimated Onset Date:   Unspecified ; Created By:   Denman George; Reaction Status:   Active ; Category:   Drug ; Substance:   No Known Medication Allergies ; Type:   Allergy ; Updated By:   Denman George; Reviewed Date:   02/18/2020 19:04 EDT        Pain Assessment   Preferred Pain Tool :   Numeric rating scale   Numeric Rating With Activity :   10 = Worst possible pain   Numeric Rating Score With Activity :   10    Pain Location :   Coccyx   Denman George - 02/18/2020 19:03 EDT   Image 4 -  Images  currently included in the form version of this document have not been included in the text rendition version of the form.   Psycho-Social   Last 3 mo, thoughts killing self/others :   Patient denies   Right click within box for Suspected Abuse policy link. :   None   Feels Safe Where Live :   Yes   Denman George - 02/18/2020 19:03 EDT   ED Reason for Visit   (As Of: 02/18/2020 19:08:08 EDT)   Diagnoses(Active)    Rash  Date:   02/18/2020 ; Diagnosis Type:   Reason For Visit ; Confirmation:   Complaint of ; Clinical Dx:   Rash ; Classification:   Medical ; Clinical Service:   Emergency medicine ; Code:   PNED ; Probability:   0 ; Diagnosis Code:   [id number]

## 2020-02-18 NOTE — ED Notes (Signed)
ED Triage Note       ED Secondary Triage Entered On:  02/18/2020 19:47 EDT    Performed On:  02/18/2020 19:46 EDT by Luanna Salk E-RN               General Information   Barriers to Learning :   None evident   ED Home Meds Section :   Document assessment   [facility] ED Fall Risk Section :   Document assessment   ED Advance Directives Section :   Document assessment   ED Palliative Screen :   N/A (prefilled for <44yo)   Luanna Salk E-RN - 02/18/2020 19:46 EDT   (As Of: 02/18/2020 19:47:22 EDT)   Diagnoses(Active)    Cellulitis of gluteal region  Date:   02/18/2020 ; Diagnosis Type:   Discharge ; Confirmation:   Confirmed ; Clinical Dx:   Cellulitis of gluteal region ; Classification:   Medical ; Clinical Service:   Non-Specified ; Code:   ICD-10-CM ; Probability:   0 ; Diagnosis Code:   L03.317      Herpes zoster  Date:   02/18/2020 ; Diagnosis Type:   Discharge ; Confirmation:   Confirmed ; Clinical Dx:   Herpes zoster ; Classification:   Medical ; Clinical Service:   Non-Specified ; Code:   ICD-10-CM ; Probability:   0 ; Diagnosis Code:   B02.9      Rash  Date:   02/18/2020 ; Diagnosis Type:   Reason For Visit ; Confirmation:   Complaint of ; Clinical Dx:   Rash ; Classification:   Medical ; Clinical Service:   Emergency medicine ; Code:   PNED ; Probability:   0 ; Diagnosis Code:   [id number]             -    Procedure History   (As Of: 02/18/2020 19:47:22 EDT)     Phoebe Perch Fall Risk Assessment Tool   Hx of falling last 3 months ED Fall :   No   Patient confused or disoriented ED Fall :   No   Patient intoxicated or sedated ED Fall :   No   Patient impaired gait ED Fall :   No   Use a mobility assistance device ED Fall :   No   Patient altered elimination ED Fall :   No   [facility] ED Fall Score :   0    Luanna Salk E-RN - 02/18/2020 19:46 EDT   ED Advance Directive   Advance Directive :   No   Luanna Salk E-RN - 02/18/2020 19:46 EDT   Med Hx   Medication List   (As Of: 02/18/2020  19:47:23 EDT)   Normal Order    acyclovir 800 mg Tab  :   acyclovir 800 mg Tab ; Status:   Ordered ; Ordered As Mnemonic:   acyclovir ; Simple Display Line:   800 mg, 1 tabs, Oral, Once ; Ordering Provider:   HEFFNER,  LINDSEY K-PA; Catalog Code:   acyclovir ; Order Dt/Tm:   02/18/2020 19:34:38 EDT          cephalexin 500 mg Cap  :   cephalexin 500 mg Cap ; Status:   Ordered ; Ordered As Mnemonic:   Keflex ; Simple Display Line:   500 mg, 1 caps, Oral, Once ; Ordering Provider:   HEFFNER,  LINDSEY K-PA; Catalog Code:   cephalexin ; Order Dt/Tm:   02/18/2020 19:33:08 EDT  Prescription/Discharge Order    acyclovir  :   acyclovir ; Status:   Prescribed ; Ordered As Mnemonic:   acyclovir 800 mg oral tablet ; Simple Display Line:   800 mg, 1 tabs, Oral, 5x/Day, for 10 days, 50 tabs, 0 Refill(s) ; Ordering Provider:   HEFFNER,  Mardella Layman K-PA; Catalog Code:   acyclovir ; Order Dt/Tm:   02/18/2020 19:34:01 EDT          cephalexin  :   cephalexin ; Status:   Prescribed ; Ordered As Mnemonic:   Keflex 500 mg oral capsule ; Simple Display Line:   500 mg, 1 caps, Oral, QID, for 7 days, 28 caps, 0 Refill(s) ; Ordering Provider:   HEFFNER,  Mardella Layman K-PA; Catalog Code:   cephalexin ; Order Dt/Tm:   02/18/2020 19:34:19 EDT

## 2020-02-18 NOTE — ED Notes (Signed)
 ED Patient Summary       ;       [facility] Emergency Department  [address], [place], [place] [address]  [phone]  Discharge Instructions (Patient)  _______________________________________     Name: [name], [name]  DOB:  10/21/1975                   MRN: [id number]                   FIN: NBR%[phone]  Reason For Visit: Rash; BUMPS ON LOWER BACK/POSSIBLE BUG BITE  Final Diagnosis: Cellulitis of gluteal region; Herpes zoster     Visit Date: 02/18/2020 18:54:00  Address: [address] [place] [address]  Phone: [phone]     Emergency Department Providers:         Primary Physician:            [name] would like to thank you for allowing us  to assist you with your healthcare needs. The following includes patient education materials and information regarding your injury/illness.     Follow-up Instructions:  You were seen today on an emergency basis. Please contact your primary care doctor for a follow up appointment. If you received a referral to a specialist doctor, it is important you follow-up as instructed.    It is important that you call your follow-up doctor to schedule and confirm the location of your next appointment. Your doctor may practice at multiple locations. The office location of your follow-up appointment may be different to the one written on your discharge instructions.    If you do not have a primary care doctor, please call [phone] for help in finding a [name]. [name]. For help in finding a specialist doctor, please call [phone].    If your condition gets worse before your follow-up with your primary care doctor or specialist, please return to the Emergency Department.      Coronavirus 2019 (COVID-19) Reminders:     Patients age 23 - 78, with parental consent, and patients over age 50 can make an appointment for a COVID-19 vaccine. Patients can contact their [facility] doctors' offices to  schedule an appointment to receive the COVID-19 vaccine. Patients who do not have a [facility] physician can call [phone] to schedule vaccination appointments.      Follow Up Appointments:  Primary Care Mercado:      Name: PCP,  NONE      Phone:                  With: Address: When:   Follow up with primary care Mercado  Within 1 week   Comments:   Return to ED if symptoms worsen   Please follow-up with recommended physician in the time frame listed. Please call with any problems or concerns. If you have no primary care doctor, please call [phone] to establish care with one. Thank you for choosing [facility]!                Printed Prescriptions:    Patient Education Materials:  Discharge Orders          Discharge Patient 02/18/20 19:35:00 EDT         Comment:      Cellulitis, Adult; Shingles     Cellulitis    Cellulitis is an infection of the skin and the tissue beneath it. The infected area is  usually red and tender. Cellulitis occurs most often in the arms and lower legs.       CAUSES    Cellulitis is caused by bacteria that enter the skin through cracks or cuts in the skin. The most common types of bacteria that cause cellulitis are staphylococci and streptococci.    SIGNS AND SYMPTOMS     Redness and warmth.     Swelling.     Tenderness or pain.     Fever.     DIAGNOSIS    Your health care Mercado can usually determine what is wrong based on a physical exam. Blood tests may also be done.    TREATMENT    Treatment usually involves taking an antibiotic medicine.    HOME CARE INSTRUCTIONS     Take your antibiotic medicine as directed by your health care Mercado. Finish the antibiotic even if you start to feel better.     Keep the infected arm or leg elevated to reduce swelling.     Apply a warm cloth to the affected area up to 4 times per day to relieve pain.      Take medicines only as directed by your health care Mercado.      Keep all follow-up visits as directed by your health care  Mercado.    SEEK MEDICAL CARE IF:     You notice red streaks coming from the infected area.     Your red area gets larger or turns dark in color.     Your bone or joint underneath the infected area becomes painful after the skin has healed.     Your infection returns in the same area or another area.     You notice a swollen bump in the infected area.     You develop new symptoms.     You have a fever.    SEEK IMMEDIATE MEDICAL CARE IF:     You feel very sleepy.     You develop vomiting or diarrhea.     You have a general ill feeling (malaise) with muscle aches and pains.    This information is not intended to replace advice given to you by your health care Mercado. Make sure you discuss any questions you have with your health care Mercado.    Document Released: 04/17/2005 Document Revised: 03/29/2015 Document Reviewed: 05/17/2015  Elsevier Interactive Patient Education ?2016 Elsevier Inc.       Shingles    Shingles, which is also known as herpes zoster, is an infection that causes a painful skin rash and fluid-filled blisters. Shingles is not related to genital herpes, which is a sexually transmitted infection.           Shingles only develops in people who:     Have had chickenpox.     Have received the chickenpox vaccine. (This is rare.)    CAUSES    Shingles is caused by varicella-zoster virus (VZV). This is the same virus that causes chickenpox. After exposure to VZV, the virus stays in the body in an inactive (dormant) state. Shingles develops if the virus reactivates. This can happen many years after the initial exposure to VZV. It is not known what causes this virus to reactivate.    RISK FACTORS    People who have had chickenpox or received the chickenpox vaccine are at risk for shingles. Infection is more common in people who:     Are older than age 26.     Have a  weakened defense (immune) system, such as those with HIV, AIDS, or cancer.     Are taking medicines that weaken the immune system, such as  transplant medicines.     Are under great stress.    SYMPTOMS    Early symptoms of this condition include itching, tingling, and pain in an area on your skin. Pain may be described as burning, stabbing, or throbbing.    A few days or weeks after symptoms start, a painful red rash appears, usually on one side of the body in a bandlike or beltlike pattern. The rash eventually turns into fluid-filled blisters that break open, scab over, and dry up in about 2?3 weeks.    At any time during the infection, you may also develop:     A fever.     Chills.     A headache.     An upset stomach.    DIAGNOSIS    This condition is diagnosed with a skin exam. Sometimes, skin or fluid samples are taken from the blisters before a diagnosis is made. These samples are examined under a microscope or sent to a lab for testing.    TREATMENT    There is no specific cure for this condition. Your health care [facility] will probably prescribe medicines to help you manage pain, recover more quickly, and avoid long-term problems. Medicines may include:     Antiviral drugs.     Anti-inflammatory drugs.     Pain medicines.    If the area involved is on your face, you may be referred to a specialist, such as an eye doctor (ophthalmologist) or an ear, nose, and throat (ENT) doctor to help you avoid eye problems, chronic pain, or disability.    HOME CARE INSTRUCTIONS    Medicines     Take medicines only as directed by your health care [facility]ado.     Apply an anti-itch or numbing cream to the affected area as directed by your health care Mercado.    Blister and Rash Care     Take a cool bath or apply cool compresses to the area of the rash or blisters as directed by your health care Mercado. This may help with pain and itching.     Keep your rash covered with a loose bandage (dressing). Wear loose-fitting clothing to help ease the pain of material rubbing against the rash.     Keep your rash and blisters clean with mild soap and cool water or as  directed by your health care Merc[facility].     Check your rash every day for signs of infection. These include redness, swelling, and pain that lasts or increases.     Do not pick your blisters.     Do not scratch your rash.    General Instructions     Rest as directed by your health care Mercado.     Keep all follow-up visits as directed by your health care Mercado. This is important.     Until your blisters scab over, your infection can cause chickenpox in people who have never had it or been vaccinated against it. To prevent this from happening, avoid contact with other people, especially:    ? Babies.    ? Pregnant women.    ? Children who have eczema.    ? Elderly people who have transplants.    ? People who have chronic illnesses, such as leukemia or AIDS.    SEEK MEDICAL CARE IF:  Your pain is not relieved with prescribed medicines.     Your pain does not get better after the rash heals.     Your rash looks infected. Signs of infection include redness, swelling, and pain that lasts or increases.    SEEK IMMEDIATE MEDICAL CARE IF:     The rash is on your face or nose.     You have facial pain, pain around your eye area, or loss of feeling on one side of your face.     You have ear pain or you have ringing in your ear.     You have loss of taste.     Your condition gets worse.    This information is not intended to replace advice given to you by your health care Mercado. Make sure you discuss any questions you have with your health care Mercado.    Document Released: 07/08/2005 Document Revised: 07/29/2014 Document Reviewed: 05/19/2014  Elsevier Interactive Patient Education ?2016 Elsevier Inc.         Allergy Info: No Known Medication Allergies     Medication Information:  [facility] Physicians provided you with a complete list of medications post discharge, if you have been instructed to stop taking a medication please ensure you also follow up with this information to your Primary Care Physician.   Unless otherwise noted, patient will continue to take medications as prescribed prior to the Emergency Room visit.  Any specific questions regarding your chronic medications and dosages should be discussed with your physician(s) and pharmacist.          acyclovir (acyclovir 800 mg oral tablet) 1 Tabs Oral (given by mouth) 5 times a day for 10 Days. Refills: 0.  cephalexin (Keflex 500 mg oral capsule) 1 Capsules Oral (given by mouth) 4 times a day for 7 Days. Refills: 0.      Medications Administered During Visit:              Medication Dose Route   cephalexin 500 mg Oral   acyclovir 800 mg Oral          Major Tests and Procedures:  The following procedures and tests were performed during your ED visit.  COMMON PROCEDURES%>  COMMON PROCEDURES COMMENTS%>          Laboratory Orders  No laboratory orders were placed.              Radiology Orders  No radiology orders were placed.              Patient Care Orders  Name Status Details   Discharge Patient Ordered 02/18/20 19:35:00 EDT   ED Assessment Adult Ordered 02/18/20 19:08:09 EDT, 02/18/20 19:08:09 EDT   ED Secondary Triage Completed 02/18/20 19:08:09 EDT, 02/18/20 19:08:09 EDT   ED Triage Adult Completed 02/18/20 18:54:48 EDT, 02/18/20 18:54:48 EDT       ---------------------------------------------------------------------------------------------------------------------  [facility]) encourages you to self-enroll in the [facility] Patient Portal.  [facility] Patient Portal will allow you to manage your personal health information securely from your own electronic device now and in the future.  To begin your Patient Portal enrollment process, please visit [email or web address] Click on "Sign up now" under [facility].  If you find that you need additional assistance on the [facility] Patient Portal or need a copy of your medical records, please call the [facility] Medical Records Office at [phone].  Comment:

## 2020-02-18 NOTE — ED Provider Notes (Signed)
Rash        Patient:   [name], [name]            MRN: [id number]            FIN: [id number]               Age:   44 years     Sex:  Female     DOB:  1975-10-23   Associated Diagnoses:   Herpes zoster; Cellulitis of gluteal region   Author:   Genevie Cheshire K-PA      Basic Information   Time seen: Provider Seen (ST)   ED Provider/Time:    Lizzie Cokley,  Seferino Oscar K-PA / 02/18/2020 19:23  .   Additional information: Chief Complaint from Nursing Triage Note   Chief Complaint  Chief Complaint: Pt to the ED with c/o painful red raised area on her lower back near her coccyx. Pt states that the area has been there for around 2 weeks. Pt denies fever. (02/18/20 19:03:00).      History of Present Illness   This is a 44 year old female presenting today for rash.  Patient states this started about 2 weeks ago, progressively worsened.  She reports a painful red rash on her lower back/buttocks on the right side.  States she has had more swelling and it appears as though it has been spreading.  She denies any fever, trauma or injuries.  States it is not pruritic.  Has never had anything like this before..        Review of Systems   Constitutional symptoms:  No fever,    Eye symptoms:  Vision unchanged.   Respiratory symptoms:  No shortness of breath,    Cardiovascular symptoms:  No chest pain,    Neurologic symptoms:  No headache, no dizziness.              Additional review of systems information: All other systems reviewed and otherwise negative.      Health Status   Allergies:    Allergic Reactions (Selected)  No Known Medication Allergies.      Past Medical/ Family/ Social History   Medical history: Reviewed as documented in chart.   Surgical history: Reviewed as documented in chart.   Family history: Not significant.   Social history: Reviewed as documented in chart.   Problem list:    No qualifying data available  .      Physical Examination               Vital Signs   Vital Signs   02/18/2020 19:03 EDT Systolic Blood  Pressure 148 mmHg  HI    Diastolic Blood Pressure 94 mmHg  HI    Temperature Oral 37 degC    Heart Rate Monitored 89 bpm    Respiratory Rate 18 br/min    SpO2 99 %   .   Measurements   02/18/2020 19:08 EDT Body Mass Index est meas 39.45 kg/m2    Body Mass Index Measured 39.45 kg/m2   02/18/2020 19:03 EDT Height/Length Measured 170 cm    Weight Dosing 114 kg   .   General:  Alert, no acute distress.    Skin:  Warm, dry, intact, Rash: Erythematous vesicular rash in the right upper gluteus, tender to palpation.  She has a central area that is more indurated draining, serious fluid.  Rashes surrounded by erythema..    Head:  Normocephalic, atraumatic.    Neck:  Supple, trachea midline.  Eye:  Normal conjunctiva.   Respiratory:  Respirations are non-labored, Symmetrical chest wall expansion.    Neurological:  Alert and oriented to person, place, time, and situation, normal sensory observed, normal motor observed, normal speech observed.    Psychiatric:  Cooperative, appropriate mood & affect.       Medical Decision Making   Rationale:  PA/NP reviewed with co-signing physician: diagnosis and plan of care.   Documents reviewed:  Emergency department nurses' notes, flowsheet, emergency department records, prior records, vital signs.       Reexamination/ Reevaluation   Notes: 44 year old female presenting today for rash x2 weeks.  Painful rash in the right gluteus, progressively worsening.  Patient is afebrile hemodynamically stable, comfortable nontoxic-appearing.  O2 sat above 92% on room air.  No known trauma, bites.  Patient has a vesicular erythematous rash on the right gluteus, surrounded by erythema and an indurated, draining serous lesion in the center.  She has moderate tenderness to palpation.  Is not sloughing, no ulcerations.  I suspect this is herpes zoster with secondary cellulitis.  Treat with acyclovir and cover cellulitis with Keflex.  She will follow-up with her primary care return for any worsening  symptoms.  She is stable for discharge..      Impression and Plan   Diagnosis   Herpes zoster (ICD10-CM B02.9, Discharge, Medical)   Cellulitis of gluteal region (ICD10-CM L03.317, Discharge, Medical)   Plan   Condition: Stable.    Disposition: Discharged: Time  02/18/2020 19:33:00, to home.    Prescriptions: [facility] prescriptions   Pharmacy:  Keflex 500 mg oral capsule (Prescribe): 500 mg, 1 caps, Oral, QID, for 7 days, 28 caps, 0 Refill(s)  acyclovir 800 mg oral tablet (Prescribe): 800 mg, 1 tabs, Oral, 5x/Day, for 10 days, 50 tabs, 0 Refill(s).    Patient was given the following educational materials: Shingles, Cellulitis, Adult.    Follow up with: Follow up with primary care provider Within 1 week Return to ED if symptoms worsen  Please follow-up with recommended physician in the time frame listed. Please call with any problems or concerns. If you have no primary care doctor, please call [phone] to establish care with one. Thank you for choosing [facility]!   Marland Kitchen    Counseled: I had a detailed discussion with the patient and/or guardian regarding the historical points/exam findings supporting the discharge diagnosis and need for outpatient followup. Discussed the need to return to the ER if symptoms persist/worsen, or for any questions/concerns that arise at home.    Signature Line     Electronically Signed on 02/18/2020 07:43 PM EDT   ________________________________________________   Genevie Cheshire K-PA      Electronically Signed on 02/18/2020 10:55 PM EDT   ________________________________________________   Larina Bras A-MD

## 2020-02-18 NOTE — ED Notes (Signed)
ED Patient Education Note     Patient Education Materials Follows:  Home Health Care     Cellulitis    Cellulitis is an infection of the skin and the tissue beneath it. The infected area is usually red and tender. Cellulitis occurs most often in the arms and lower legs.       CAUSES    Cellulitis is caused by bacteria that enter the skin through cracks or cuts in the skin. The most common types of bacteria that cause cellulitis are staphylococci and streptococci.    SIGNS AND SYMPTOMS     Redness and warmth.     Swelling.     Tenderness or pain.     Fever.     DIAGNOSIS    Your health care provider can usually determine what is wrong based on a physical exam. Blood tests may also be done.    TREATMENT    Treatment usually involves taking an antibiotic medicine.    HOME CARE INSTRUCTIONS     Take your antibiotic medicine as directed by your health care provider. Finish the antibiotic even if you start to feel better.     Keep the infected arm or leg elevated to reduce swelling.     Apply a warm cloth to the affected area up to 4 times per day to relieve pain.      Take medicines only as directed by your health care provider.      Keep all follow-up visits as directed by your health care provider.    SEEK MEDICAL CARE IF:     You notice red streaks coming from the infected area.     Your red area gets larger or turns dark in color.     Your bone or joint underneath the infected area becomes painful after the skin has healed.     Your infection returns in the same area or another area.     You notice a swollen bump in the infected area.     You develop new symptoms.     You have a fever.    SEEK IMMEDIATE MEDICAL CARE IF:     You feel very sleepy.     You develop vomiting or diarrhea.     You have a general ill feeling (malaise) with muscle aches and pains.    This information is not intended to replace advice given to you by your health care provider. Make sure you discuss any questions you have with your health care  provider.    Document Released: 04/17/2005 Document Revised: 03/29/2015 Document Reviewed: 05/17/2015  Elsevier Interactive Patient Education ?2016 [employer]      Infectious Disease     Shingles    Shingles, which is also known as herpes zoster, is an infection that causes a painful skin rash and fluid-filled blisters. Shingles is not related to genital herpes, which is a sexually transmitted infection.           Shingles only develops in people who:     Have had chickenpox.     Have received the chickenpox vaccine. (This is rare.)    CAUSES    Shingles is caused by varicella-zoster virus (VZV). This is the same virus that causes chickenpox. After exposure to VZV, the virus stays in the body in an inactive (dormant) state. Shingles develops if the virus reactivates. This can happen many years after the initial exposure to VZV. It is not known what causes this virus to reactivate.      RISK FACTORS    People who have had chickenpox or received the chickenpox vaccine are at risk for shingles. Infection is more common in people who:     Are older than age 50.     Have a weakened defense (immune) system, such as those with HIV, AIDS, or cancer.     Are taking medicines that weaken the immune system, such as transplant medicines.     Are under great stress.    SYMPTOMS    Early symptoms of this condition include itching, tingling, and pain in an area on your skin. Pain may be described as burning, stabbing, or throbbing.    A few days or weeks after symptoms start, a painful red rash appears, usually on one side of the body in a bandlike or beltlike pattern. The rash eventually turns into fluid-filled blisters that break open, scab over, and dry up in about 2?3 weeks.    At any time during the infection, you may also develop:     A fever.     Chills.     A headache.     An upset stomach.    DIAGNOSIS    This condition is diagnosed with a skin exam. Sometimes, skin or fluid samples are taken from the blisters before a  diagnosis is made. These samples are examined under a microscope or sent to a lab for testing.    TREATMENT    There is no specific cure for this condition. Your health care provider will probably prescribe medicines to help you manage pain, recover more quickly, and avoid long-term problems. Medicines may include:     Antiviral drugs.     Anti-inflammatory drugs.     Pain medicines.    If the area involved is on your face, you may be referred to a specialist, such as an eye doctor (ophthalmologist) or an ear, nose, and throat (ENT) doctor to help you avoid eye problems, chronic pain, or disability.    HOME CARE INSTRUCTIONS    Medicines     Take medicines only as directed by your health care provider.     Apply an anti-itch or numbing cream to the affected area as directed by your health care provider.    Blister and Rash Care     Take a cool bath or apply cool compresses to the area of the rash or blisters as directed by your health care provider. This may help with pain and itching.     Keep your rash covered with a loose bandage (dressing). Wear loose-fitting clothing to help ease the pain of material rubbing against the rash.     Keep your rash and blisters clean with mild soap and cool water or as directed by your health care provider.     Check your rash every day for signs of infection. These include redness, swelling, and pain that lasts or increases.     Do not pick your blisters.     Do not scratch your rash.    General Instructions     Rest as directed by your health care provider.     Keep all follow-up visits as directed by your health care provider. This is important.     Until your blisters scab over, your infection can cause chickenpox in people who have never had it or been vaccinated against it. To prevent this from happening, avoid contact with other people, especially:    ? Babies.    ? Pregnant women.    ?   Children who have eczema.    ? Elderly people who have transplants.    ? People who have  chronic illnesses, such as leukemia or AIDS.    SEEK MEDICAL CARE IF:     Your pain is not relieved with prescribed medicines.     Your pain does not get better after the rash heals.     Your rash looks infected. Signs of infection include redness, swelling, and pain that lasts or increases.    SEEK IMMEDIATE MEDICAL CARE IF:     The rash is on your face or nose.     You have facial pain, pain around your eye area, or loss of feeling on one side of your face.     You have ear pain or you have ringing in your ear.     You have loss of taste.     Your condition gets worse.    This information is not intended to replace advice given to you by your health care provider. Make sure you discuss any questions you have with your health care provider.    Document Released: 07/08/2005 Document Revised: 07/29/2014 Document Reviewed: 05/19/2014  Elsevier Interactive Patient Education ?2016 Elsevier Inc.

## 2020-02-18 NOTE — Discharge Summary (Signed)
ED Clinical Summary                        [facility]  [address]  [place], [place], [address]  [phone]           PERSON INFORMATION  Name: [name], Taraann Mercado Age:  44 Years DOB: [birth year]   Sex: Female Language: English PCP: PCP,  NONE   Marital Status: Single Phone: [phone] Med Service: MED-Medicine   MRN: [id number] Acct# [id number] Arrival: 02/18/2020 18:54:00   Visit Reason: Rash; BUMPS ON LOWER BACK/POSSIBLE BUG BITE Acuity: 4 LOS: 000 01:16   Address:    [address] [place] [address]   Diagnosis:    Cellulitis of gluteal region; Herpes zoster  Medications:          New Medications  Printed Prescriptions  acyclovir (acyclovir 800 mg oral tablet) 1 Tabs Oral (given by mouth) 5 times a day for 10 Days. Refills: 0.  Last Dose:____________________  cephalexin (Keflex 500 mg oral capsule) 1 Capsules Oral (given by mouth) 4 times a day for 7 Days. Refills: 0.  Last Dose:____________________      Medications Administered During Visit:                Medication Dose Route   cephalexin 500 mg Oral   acyclovir 800 mg Oral               Allergies      No Known Medication Allergies      Major Tests and Procedures:  The following procedures and tests were performed during your ED visit.  COMMON PROCEDURES%>  COMMON PROCEDURES COMMENTS%>                PROVIDER INFORMATION               Provider Role Assigned Aurora Mask K-PA ED MidLevel 02/18/2020 19:23:46    Luanna Salk E-RN ED Nurse 02/18/2020 19:32:49        Attending Physician:  Genevie Cheshire K-PA      Admit Doc  HEFFNER,  LINDSEY K-PA     Consulting Doc       VITALS INFORMATION  Vital Sign Triage Latest   Temp Oral ORAL_1%> ORAL%>   Temp Temporal TEMPORAL_1%> TEMPORAL%>   Temp Intravascular INTRAVASCULAR_1%> INTRAVASCULAR%>   Temp Axillary AXILLARY_1%> AXILLARY%>   Temp Rectal RECTAL_1%> RECTAL%>   02 Sat 99 % 99 %   Respiratory Rate RATE_1%> RATE%>   Peripheral Pulse Rate PULSE RATE_1%> PULSE RATE%>    Apical Heart Rate HEART RATE_1%> HEART RATE%>   Blood Pressure BLOOD PRESSURE_1%>/ BLOOD PRESSURE_1%>94 mmHg BLOOD PRESSURE%> / BLOOD PRESSURE%>94 mmHg                 Immunizations      No Immunizations Documented This Visit          DISCHARGE INFORMATION   Discharge Disposition: H Outpt-Sent Home   Discharge Location:  Home   Discharge Date and Time:  02/18/2020 20:10:51   ED Checkout Date and Time:  02/18/2020 20:10:51     DEPART REASON INCOMPLETE INFORMATION               Depart Action Incomplete Reason   Interactive View/I&O Recently assessed               Problems      No Problems Documented  Smoking Status      No Smoking Status Documented         PATIENT EDUCATION INFORMATION  Instructions:     Cellulitis, Adult; Shingles     Follow up:                   With: Address: When:   Follow up with primary care provider  Within 1 week   Comments:   Return to ED if symptoms worsen   Please follow-up with recommended physician in the time frame listed. Please call with any problems or concerns. If you have no primary care doctor, please call [phone] to establish care with one. Thank you for choosing [facility]!                ED PROVIDER DOCUMENTATION     Patient:   [name], [name]            MRN: [id number]            FIN: [id number]               Age:   47 years     Sex:  Female     DOB:  21-Mar-1976   Associated Diagnoses:   Herpes zoster; Cellulitis of gluteal region   Author:   Genevie Cheshire K-PA      Basic Information   Time seen: Provider Seen (ST)   ED Provider/Time:    HEFFNER,  LINDSEY K-PA / 02/18/2020 19:23  .   Additional information: Chief Complaint from Nursing Triage Note   Chief Complaint  Chief Complaint: Pt to the ED with c/o painful red raised area on her lower back near her coccyx. Pt states that the area has been there for around 2 weeks. Pt denies fever. (02/18/20 19:03:00).      History of Present Illness   This is a 44 year old female presenting today for rash.  Patient  states this started about 2 weeks ago, progressively worsened.  She reports a painful red rash on her lower back/buttocks on the right side.  States she has had more swelling and it appears as though it has been spreading.  She denies any fever, trauma or injuries.  States it is not pruritic.  Has never had anything like this before..        Review of Systems   Constitutional symptoms:  No fever,    Eye symptoms:  Vision unchanged.   Respiratory symptoms:  No shortness of breath,    Cardiovascular symptoms:  No chest pain,    Neurologic symptoms:  No headache, no dizziness.              Additional review of systems information: All other systems reviewed and otherwise negative.      Health Status   Allergies:    Allergic Reactions (Selected)  No Known Medication Allergies.      Past Medical/ Family/ Social History   Medical history: Reviewed as documented in chart.   Surgical history: Reviewed as documented in chart.   Family history: Not significant.   Social history: Reviewed as documented in chart.   Problem list:    No qualifying data available  .      Physical Examination               Vital Signs   Vital Signs   02/18/2020 19:03 EDT Systolic Blood Pressure 148 mmHg  HI    Diastolic Blood Pressure 94 mmHg  HI  Temperature Oral 37 degC    Heart Rate Monitored 89 bpm    Respiratory Rate 18 br/min    SpO2 99 %   .   Measurements   02/18/2020 19:08 EDT Body Mass Index est meas 39.45 kg/m2    Body Mass Index Measured 39.45 kg/m2   02/18/2020 19:03 EDT Height/Length Measured 170 cm    Weight Dosing 114 kg   .   General:  Alert, no acute distress.    Skin:  Warm, dry, intact, Rash: Erythematous vesicular rash in the right upper gluteus, tender to palpation.  She has a central area that is more indurated draining, serious fluid.  Rashes surrounded by erythema..    Head:  Normocephalic, atraumatic.    Neck:  Supple, trachea midline.    Eye:  Normal conjunctiva.   Respiratory:  Respirations are non-labored, Symmetrical  chest wall expansion.    Neurological:  Alert and oriented to person, place, time, and situation, normal sensory observed, normal motor observed, normal speech observed.    Psychiatric:  Cooperative, appropriate mood & affect.       Medical Decision Making   Rationale:  PA/NP reviewed with co-signing physician: diagnosis and plan of care.   Documents reviewed:  Emergency department nurses' notes, flowsheet, emergency department records, prior records, vital signs.       Reexamination/ Reevaluation   Notes: 44 year old female presenting today for rash x2 weeks.  Painful rash in the right gluteus, progressively worsening.  Patient is afebrile hemodynamically stable, comfortable nontoxic-appearing.  O2 sat above 92% on room air.  No known trauma, bites.  Patient has a vesicular erythematous rash on the right gluteus, surrounded by erythema and an indurated, draining serous lesion in the center.  She has moderate tenderness to palpation.  Is not sloughing, no ulcerations.  I suspect this is herpes zoster with secondary cellulitis.  Treat with acyclovir and cover cellulitis with Keflex.  She will follow-up with her primary care return for any worsening symptoms.  She is stable for discharge..      Impression and Plan   Diagnosis   Herpes zoster (ICD10-CM B02.9, Discharge, Medical)   Cellulitis of gluteal region (ICD10-CM L03.317, Discharge, Medical)   Plan   Condition: Stable.    Disposition: Discharged: Time  02/18/2020 19:33:00, to home.    Prescriptions: Launch prescriptions   Pharmacy:  Keflex 500 mg oral capsule (Prescribe): 500 mg, 1 caps, Oral, QID, for 7 days, 28 caps, 0 Refill(s)  acyclovir 800 mg oral tablet (Prescribe): 800 mg, 1 tabs, Oral, 5x/Day, for 10 days, 50 tabs, 0 Refill(s).    Patient was given the following educational materials: Shingles, Cellulitis, Adult.    Follow up with: Follow up with primary care provider Within 1 week Return to ED if symptoms worsen  Please follow-up with recommended  physician in the time frame listed. Please call with any problems or concerns. If you have no primary care doctor, please call [phone] to establish care with one. Thank you for choosing [facility]!   Marland Kitchen    Counseled: I had a detailed discussion with the patient and/or guardian regarding the historical points/exam findings supporting the discharge diagnosis and need for outpatient followup. Discussed the need to return to the ER if symptoms persist/worsen, or for any questions/concerns that arise at home.
